# Patient Record
Sex: Female | Born: 1950 | Race: Black or African American | Hispanic: No | Smoking: Never smoker
Health system: Southern US, Community
[De-identification: ages and names within clinical notes are randomized; demographics above are authoritative.]

## PROBLEM LIST (undated history)

## (undated) DIAGNOSIS — M8580 Other specified disorders of bone density and structure, unspecified site: Secondary | ICD-10-CM

## (undated) DIAGNOSIS — Z86718 Personal history of other venous thrombosis and embolism: Secondary | ICD-10-CM

## (undated) DIAGNOSIS — E785 Hyperlipidemia, unspecified: Secondary | ICD-10-CM

## (undated) DIAGNOSIS — M25559 Pain in unspecified hip: Secondary | ICD-10-CM

## (undated) DIAGNOSIS — I2510 Atherosclerotic heart disease of native coronary artery without angina pectoris: Secondary | ICD-10-CM

## (undated) DIAGNOSIS — I10 Essential (primary) hypertension: Secondary | ICD-10-CM

## (undated) DIAGNOSIS — R112 Nausea with vomiting, unspecified: Secondary | ICD-10-CM

## (undated) DIAGNOSIS — R109 Unspecified abdominal pain: Secondary | ICD-10-CM

## (undated) DIAGNOSIS — Z9889 Other specified postprocedural states: Secondary | ICD-10-CM

## (undated) DIAGNOSIS — R51 Headache: Secondary | ICD-10-CM

## (undated) HISTORY — DX: Pain in unspecified hip: M25.559

## (undated) HISTORY — DX: Personal history of other venous thrombosis and embolism: Z86.718

## (undated) HISTORY — DX: Essential (primary) hypertension: I10

## (undated) HISTORY — PX: CARDIAC CATHETERIZATION: SHX172

## (undated) HISTORY — DX: Unspecified abdominal pain: R10.9

## (undated) HISTORY — PX: TUBAL LIGATION: SHX77

## (undated) HISTORY — PX: BREAST SURGERY: SHX581

## (undated) HISTORY — DX: Hyperlipidemia, unspecified: E78.5

## (undated) HISTORY — DX: Atherosclerotic heart disease of native coronary artery without angina pectoris: I25.10

## (undated) HISTORY — PX: ABDOMINAL HYSTERECTOMY: SHX81

## (undated) HISTORY — DX: Other specified disorders of bone density and structure, unspecified site: M85.80

## (undated) HISTORY — PX: BREAST BIOPSY: SHX20

---

## 2005-05-31 ENCOUNTER — Emergency Department: Payer: Self-pay | Admitting: Emergency Medicine

## 2006-01-10 ENCOUNTER — Inpatient Hospital Stay: Payer: Self-pay | Admitting: Cardiovascular Disease

## 2006-01-10 ENCOUNTER — Other Ambulatory Visit: Payer: Self-pay

## 2008-05-06 ENCOUNTER — Ambulatory Visit: Payer: Self-pay | Admitting: Gastroenterology

## 2009-12-24 ENCOUNTER — Ambulatory Visit: Payer: Self-pay

## 2010-02-11 ENCOUNTER — Ambulatory Visit: Payer: Self-pay

## 2012-02-09 ENCOUNTER — Ambulatory Visit: Payer: Self-pay

## 2012-03-08 ENCOUNTER — Ambulatory Visit: Payer: Self-pay | Admitting: Unknown Physician Specialty

## 2012-03-22 ENCOUNTER — Encounter (HOSPITAL_COMMUNITY): Payer: Self-pay | Admitting: Pharmacy Technician

## 2012-03-22 ENCOUNTER — Other Ambulatory Visit: Payer: Self-pay | Admitting: Neurosurgery

## 2012-03-23 ENCOUNTER — Encounter (HOSPITAL_COMMUNITY)
Admission: RE | Admit: 2012-03-23 | Discharge: 2012-03-23 | Disposition: A | Discharge status: Home / Self Care | Payer: BC Managed Care – PPO | Source: Ambulatory Visit | Attending: Neurosurgery | Admitting: Neurosurgery

## 2012-03-23 ENCOUNTER — Encounter (HOSPITAL_COMMUNITY): Payer: Self-pay | Admitting: *Deleted

## 2012-03-23 LAB — BASIC METABOLIC PANEL
BUN: 19 mg/dL (ref 6–23)
CO2: 28 mEq/L (ref 19–32)
Calcium: 9.8 mg/dL (ref 8.4–10.5)
Chloride: 103 mEq/L (ref 96–112)
Creatinine, Ser: 0.71 mg/dL (ref 0.50–1.10)
GFR calc Af Amer: >90 mL/min (ref >90)
GFR calc non Af Amer: >90 mL/min (ref >90)
Glucose, Bld: 113 mg/dL — ABNORMAL HIGH (ref 70–99)
Potassium: 3.9 mEq/L (ref 3.5–5.1)
Sodium: 141 mEq/L (ref 135–145)

## 2012-03-23 LAB — SURGICAL PCR SCREEN
MRSA, PCR: NEGATIVE
Staphylococcus aureus: NEGATIVE

## 2012-03-23 LAB — CBC
HCT: 38.8 % (ref 36.0–46.0)
Hemoglobin: 12.8 g/dL (ref 12.0–15.0)
MCH: 27.2 pg (ref 26.0–34.0)
MCHC: 33 g/dL (ref 30.0–36.0)
MCV: 82.6 fL (ref 78.0–100.0)
Platelets: 348 10*3/uL (ref 150–400)
RBC: 4.7 MIL/uL (ref 3.87–5.11)
RDW: 13.6 % (ref 11.5–15.5)
WBC: 7.1 10*3/uL (ref 4.0–10.5)

## 2012-03-23 MED ORDER — DEXAMETHASONE SODIUM PHOSPHATE 10 MG/ML IJ SOLN
10.0000 mg | INTRAMUSCULAR | Status: AC
  Administered 2012-03-24: 10 mg via INTRAVENOUS
  Filled 2012-03-23: qty 1

## 2012-03-23 MED ORDER — CEFAZOLIN SODIUM-DEXTROSE 2-3 GM-% IV SOLR
2.0000 g | INTRAVENOUS | Status: AC
  Administered 2012-03-24: 2 g via INTRAVENOUS
  Filled 2012-03-23: qty 50

## 2012-03-23 NOTE — Progress Notes (Signed)
Pt said she had a  Cardiac cath 3 or so years ago at [facility], I faxed a request for this information a nd any Stress test , EKG, labs.  Pt was not sure of cardiologist name that did procedure.  I faxed request to Dr Valla Leaver office in [place] for any information, EKG and office notes..  Pt has a paper from Dr Trudee Grip office stating that OR is at 1230, or schedule says 1342, I called and spoke with Erie Noe at Dr Trudee Grip office , she said the plan is for OR at 12:30, pt to be here at 10:30.  Pt was so instructed.

## 2012-03-23 NOTE — Pre-Procedure Instructions (Signed)
20 Mary Maynard  03/23/2012   Your procedure is scheduled on: Friday, July 19th.  Report to [facility] at 11:40 AM.  Call this number if you have problems the morning of surgery: [phone]   Remember:   Do not eat food:After Midnight.     Take these medicines the morning of surgery with A SIP OF WATER: Amlodipine (Norvasc).   Do not wear jewelry, make-up or nail polish.  Do not wear lotions, powders, or perfumes. You may wear deodorant.  Do not shave 48 hours prior to surgery. Men may shave face and neck.  Do not bring valuables to the hospital.  Contacts, dentures or bridgework may not be worn into surgery.  Leave suitcase in the car. After surgery it may be brought to your room.  For patients admitted to the hospital, checkout time is 11:00 AM the day of discharge.   Patients discharged the day of surgery will not be allowed to drive home.  Name and phone number of your driver: NA  Special Instructions: CHG Shower Use Special Wash: 1/2 bottle night before surgery and 1/2 bottle morning of surgery.   Please read over the following fact sheets that you were given: Pain Booklet, Coughing and Deep Breathing and Surgical Site Infection Prevention

## 2012-03-24 ENCOUNTER — Encounter (HOSPITAL_COMMUNITY): Payer: Self-pay | Admitting: Critical Care Medicine

## 2012-03-24 ENCOUNTER — Observation Stay (HOSPITAL_COMMUNITY)
Admission: RE | Admit: 2012-03-24 | Discharge: 2012-03-25 | DRG: 865 | Disposition: A | Discharge status: Home / Self Care | Payer: BC Managed Care – PPO | Source: Ambulatory Visit | Attending: Neurosurgery | Admitting: Neurosurgery

## 2012-03-24 ENCOUNTER — Encounter (HOSPITAL_COMMUNITY)
Admission: RE | Disposition: A | Discharge status: Home / Self Care | Payer: Self-pay | Source: Ambulatory Visit | Attending: Neurosurgery

## 2012-03-24 ENCOUNTER — Ambulatory Visit (HOSPITAL_COMMUNITY): Payer: BC Managed Care – PPO

## 2012-03-24 ENCOUNTER — Encounter (HOSPITAL_COMMUNITY): Payer: Self-pay | Admitting: *Deleted

## 2012-03-24 ENCOUNTER — Ambulatory Visit (HOSPITAL_COMMUNITY): Payer: BC Managed Care – PPO | Admitting: Critical Care Medicine

## 2012-03-24 DIAGNOSIS — M5020 Other cervical disc displacement, unspecified cervical region: Principal | ICD-10-CM | POA: Insufficient documentation

## 2012-03-24 DIAGNOSIS — Z01812 Encounter for preprocedural laboratory examination: Secondary | ICD-10-CM | POA: Insufficient documentation

## 2012-03-24 DIAGNOSIS — Z79899 Other long term (current) drug therapy: Secondary | ICD-10-CM | POA: Insufficient documentation

## 2012-03-24 DIAGNOSIS — E119 Type 2 diabetes mellitus without complications: Secondary | ICD-10-CM | POA: Insufficient documentation

## 2012-03-24 DIAGNOSIS — I10 Essential (primary) hypertension: Secondary | ICD-10-CM | POA: Insufficient documentation

## 2012-03-24 DIAGNOSIS — Z01818 Encounter for other preprocedural examination: Secondary | ICD-10-CM | POA: Insufficient documentation

## 2012-03-24 DIAGNOSIS — Z01810 Encounter for preprocedural cardiovascular examination: Secondary | ICD-10-CM | POA: Insufficient documentation

## 2012-03-24 HISTORY — PX: ANTERIOR CERVICAL DECOMP/DISCECTOMY FUSION: SHX1161

## 2012-03-24 HISTORY — DX: Headache: R51

## 2012-03-24 HISTORY — DX: Nausea with vomiting, unspecified: R11.2

## 2012-03-24 HISTORY — DX: Other specified postprocedural states: Z98.890

## 2012-03-24 LAB — GLUCOSE, CAPILLARY
Glucose-Capillary: 159 mg/dL — ABNORMAL HIGH (ref 70–99)
Glucose-Capillary: 252 mg/dL — ABNORMAL HIGH (ref 70–99)
Glucose-Capillary: 266 mg/dL — ABNORMAL HIGH (ref 70–99)

## 2012-03-24 SURGERY — ANTERIOR CERVICAL DECOMPRESSION/DISCECTOMY FUSION 1 LEVEL
Anesthesia: General | Site: Neck | Laterality: Bilateral | Wound class: Clean

## 2012-03-24 MED ORDER — INSULIN ASPART 100 UNIT/ML ~~LOC~~ SOLN
0.0000 [IU] | Freq: Three times a day (TID) | SUBCUTANEOUS | Status: DC
  Administered 2012-03-25: 5 [IU] via SUBCUTANEOUS

## 2012-03-24 MED ORDER — ONDANSETRON HCL 4 MG/2ML IJ SOLN
INTRAMUSCULAR | Status: DC | PRN
  Administered 2012-03-24: 4 mg via INTRAVENOUS

## 2012-03-24 MED ORDER — 0.9 % SODIUM CHLORIDE (POUR BTL) OPTIME
TOPICAL | Status: DC | PRN
  Administered 2012-03-24: 1000 mL

## 2012-03-24 MED ORDER — ONDANSETRON HCL 4 MG/2ML IJ SOLN
4.0000 mg | INTRAMUSCULAR | Status: DC | PRN
  Administered 2012-03-24: 4 mg via INTRAVENOUS
  Filled 2012-03-24: qty 2

## 2012-03-24 MED ORDER — INSULIN ASPART 100 UNIT/ML ~~LOC~~ SOLN
0.0000 [IU] | Freq: Every day | SUBCUTANEOUS | Status: DC
  Administered 2012-03-24: 3 [IU] via SUBCUTANEOUS

## 2012-03-24 MED ORDER — HYDROMORPHONE HCL PF 1 MG/ML IJ SOLN
INTRAMUSCULAR | Status: AC
  Filled 2012-03-24: qty 1

## 2012-03-24 MED ORDER — METFORMIN HCL 500 MG PO TABS
1000.0000 mg | ORAL_TABLET | Freq: Two times a day (BID) | ORAL | Status: DC
  Administered 2012-03-25: 1000 mg via ORAL
  Filled 2012-03-24 (×4): qty 2

## 2012-03-24 MED ORDER — ACETAMINOPHEN 650 MG RE SUPP: 650.0000 mg | RECTAL | Status: DC | PRN

## 2012-03-24 MED ORDER — HYDROCODONE-ACETAMINOPHEN 5-325 MG PO TABS
1.0000 | ORAL_TABLET | ORAL | Status: DC | PRN
  Administered 2012-03-24 – 2012-03-25 (×2): 1 via ORAL
  Filled 2012-03-24: qty 1

## 2012-03-24 MED ORDER — HYDROCHLOROTHIAZIDE 12.5 MG PO CAPS
12.5000 mg | ORAL_CAPSULE | Freq: Every day | ORAL | Status: DC
  Administered 2012-03-25: 12.5 mg via ORAL
  Filled 2012-03-24 (×2): qty 1

## 2012-03-24 MED ORDER — METHOCARBAMOL 100 MG/ML IJ SOLN
500.0000 mg | Freq: Four times a day (QID) | INTRAVENOUS | Status: DC | PRN
  Filled 2012-03-24: qty 5

## 2012-03-24 MED ORDER — EPHEDRINE SULFATE 50 MG/ML IJ SOLN
INTRAMUSCULAR | Status: DC | PRN
  Administered 2012-03-24: 10 mg via INTRAVENOUS
  Administered 2012-03-24 (×2): 5 mg via INTRAVENOUS

## 2012-03-24 MED ORDER — ACETAMINOPHEN 325 MG PO TABS: 650.0000 mg | ORAL_TABLET | ORAL | Status: DC | PRN

## 2012-03-24 MED ORDER — MENTHOL 3 MG MT LOZG
1.0000 | LOZENGE | OROMUCOSAL | Status: DC | PRN
  Administered 2012-03-24: 3 mg via ORAL
  Filled 2012-03-24: qty 9

## 2012-03-24 MED ORDER — METHOCARBAMOL 500 MG PO TABS: 500.0000 mg | ORAL_TABLET | Freq: Four times a day (QID) | ORAL | Status: DC | PRN

## 2012-03-24 MED ORDER — PHENOL 1.4 % MT LIQD
1.0000 | OROMUCOSAL | Status: DC | PRN
  Administered 2012-03-24: 1 via OROMUCOSAL
  Filled 2012-03-24: qty 177

## 2012-03-24 MED ORDER — HYDROCODONE-ACETAMINOPHEN 5-325 MG PO TABS
ORAL_TABLET | ORAL | Status: AC
  Filled 2012-03-24: qty 1

## 2012-03-24 MED ORDER — FENTANYL CITRATE 0.05 MG/ML IJ SOLN
INTRAMUSCULAR | Status: DC | PRN
  Administered 2012-03-24: 50 ug via INTRAVENOUS
  Administered 2012-03-24: 150 ug via INTRAVENOUS
  Administered 2012-03-24 (×2): 50 ug via INTRAVENOUS

## 2012-03-24 MED ORDER — NEOSTIGMINE METHYLSULFATE 1 MG/ML IJ SOLN
INTRAMUSCULAR | Status: DC | PRN
  Administered 2012-03-24: 4 mg via INTRAVENOUS

## 2012-03-24 MED ORDER — HYDROMORPHONE HCL PF 1 MG/ML IJ SOLN
0.2500 mg | INTRAMUSCULAR | Status: DC | PRN
  Administered 2012-03-24 (×2): 0.5 mg via INTRAVENOUS

## 2012-03-24 MED ORDER — HYDROMORPHONE HCL PF 1 MG/ML IJ SOLN
1.0000 mg | INTRAMUSCULAR | Status: DC | PRN
  Administered 2012-03-24 – 2012-03-25 (×2): 1 mg via INTRAMUSCULAR
  Filled 2012-03-24 (×2): qty 1

## 2012-03-24 MED ORDER — INSULIN ASPART 100 UNIT/ML ~~LOC~~ SOLN
4.0000 [IU] | Freq: Three times a day (TID) | SUBCUTANEOUS | Status: DC
  Administered 2012-03-25: 4 [IU] via SUBCUTANEOUS

## 2012-03-24 MED ORDER — SODIUM CHLORIDE 0.9 % IJ SOLN: 3.0000 mL | Freq: Two times a day (BID) | INTRAMUSCULAR | Status: DC

## 2012-03-24 MED ORDER — AMLODIPINE BESYLATE 5 MG PO TABS
5.0000 mg | ORAL_TABLET | Freq: Every day | ORAL | Status: DC
  Administered 2012-03-25: 5 mg via ORAL
  Filled 2012-03-24: qty 1

## 2012-03-24 MED ORDER — LACTATED RINGERS IV SOLN
INTRAVENOUS | Status: DC | PRN
  Administered 2012-03-24 (×2): via INTRAVENOUS

## 2012-03-24 MED ORDER — ARTIFICIAL TEARS OP OINT
TOPICAL_OINTMENT | OPHTHALMIC | Status: DC | PRN
  Administered 2012-03-24: 1 via OPHTHALMIC

## 2012-03-24 MED ORDER — BACITRACIN 50000 UNITS IM SOLR
INTRAMUSCULAR | Status: AC
  Filled 2012-03-24: qty 1

## 2012-03-24 MED ORDER — SODIUM CHLORIDE 0.9 % IR SOLN
Status: DC | PRN
  Administered 2012-03-24: 14:00:00

## 2012-03-24 MED ORDER — KCL IN DEXTROSE-NACL 20-5-0.45 MEQ/L-%-% IV SOLN
80.0000 mL/h | INTRAVENOUS | Status: DC
  Administered 2012-03-24: 80 mL/h via INTRAVENOUS
  Filled 2012-03-24 (×3): qty 1000

## 2012-03-24 MED ORDER — HEMOSTATIC AGENTS (NO CHARGE) OPTIME
TOPICAL | Status: DC | PRN
  Administered 2012-03-24: 1 via TOPICAL

## 2012-03-24 MED ORDER — METHOCARBAMOL 100 MG/ML IJ SOLN
500.0000 mg | INTRAVENOUS | Status: DC
  Administered 2012-03-24: 500 mg via INTRAVENOUS
  Filled 2012-03-24: qty 5

## 2012-03-24 MED ORDER — SODIUM CHLORIDE 0.9 % IJ SOLN: 3.0000 mL | INTRAMUSCULAR | Status: DC | PRN

## 2012-03-24 MED ORDER — GLYCOPYRROLATE 0.2 MG/ML IJ SOLN
INTRAMUSCULAR | Status: DC | PRN
  Administered 2012-03-24: 0.6 mg via INTRAVENOUS

## 2012-03-24 MED ORDER — LIDOCAINE HCL (CARDIAC) 20 MG/ML IV SOLN
INTRAVENOUS | Status: DC | PRN
  Administered 2012-03-24: 100 mg via INTRAVENOUS

## 2012-03-24 MED ORDER — MIDAZOLAM HCL 5 MG/5ML IJ SOLN
INTRAMUSCULAR | Status: DC | PRN
  Administered 2012-03-24: 2 mg via INTRAVENOUS

## 2012-03-24 MED ORDER — THROMBIN 5000 UNITS EX KIT
PACK | CUTANEOUS | Status: DC | PRN
  Administered 2012-03-24 (×2): 5000 [IU] via TOPICAL

## 2012-03-24 MED ORDER — ONDANSETRON HCL 4 MG/2ML IJ SOLN: 4.0000 mg | Freq: Once | INTRAMUSCULAR | Status: DC | PRN

## 2012-03-24 MED ORDER — LOSARTAN POTASSIUM-HCTZ 100-12.5 MG PO TABS: 1.0000 | ORAL_TABLET | Freq: Every day | ORAL | Status: DC

## 2012-03-24 MED ORDER — PROPOFOL 10 MG/ML IV EMUL
INTRAVENOUS | Status: DC | PRN
  Administered 2012-03-24: 120 mg via INTRAVENOUS

## 2012-03-24 MED ORDER — VECURONIUM BROMIDE 10 MG IV SOLR
INTRAVENOUS | Status: DC | PRN
  Administered 2012-03-24: 1 mg via INTRAVENOUS

## 2012-03-24 MED ORDER — ROCURONIUM BROMIDE 100 MG/10ML IV SOLN
INTRAVENOUS | Status: DC | PRN
  Administered 2012-03-24: 50 mg via INTRAVENOUS

## 2012-03-24 MED ORDER — SIMVASTATIN 5 MG PO TABS
5.0000 mg | ORAL_TABLET | Freq: Every day | ORAL | Status: DC
  Filled 2012-03-24 (×2): qty 1

## 2012-03-24 MED ORDER — CEFAZOLIN SODIUM 1-5 GM-% IV SOLN
1.0000 g | Freq: Three times a day (TID) | INTRAVENOUS | Status: AC
  Administered 2012-03-24 – 2012-03-25 (×2): 1 g via INTRAVENOUS
  Filled 2012-03-24 (×2): qty 50

## 2012-03-24 MED ORDER — SODIUM CHLORIDE 0.9 % IV SOLN
INTRAVENOUS | Status: AC
  Filled 2012-03-24: qty 500

## 2012-03-24 MED ORDER — LOSARTAN POTASSIUM 50 MG PO TABS
100.0000 mg | ORAL_TABLET | Freq: Every day | ORAL | Status: DC
  Administered 2012-03-25: 100 mg via ORAL
  Filled 2012-03-24 (×2): qty 2

## 2012-03-24 SURGICAL SUPPLY — 54 items
BAG DECANTER FOR FLEXI CONT (MISCELLANEOUS) ×2 IMPLANT
BENZOIN TINCTURE PRP APPL 2/3 (GAUZE/BANDAGES/DRESSINGS) ×2 IMPLANT
BRUSH SCRUB EZ PLAIN DRY (MISCELLANEOUS) ×2 IMPLANT
CANISTER SUCTION 2500CC (MISCELLANEOUS) ×2 IMPLANT
CLOTH BEACON ORANGE TIMEOUT ST (SAFETY) ×2 IMPLANT
CONT SPEC 4OZ CLIKSEAL STRL BL (MISCELLANEOUS) ×2 IMPLANT
DRAPE C-ARM 42X72 X-RAY (DRAPES) ×4 IMPLANT
DRAPE LAPAROTOMY 100X72 PEDS (DRAPES) ×2 IMPLANT
DRAPE MICROSCOPE ZEISS OPMI (DRAPES) ×2 IMPLANT
DRAPE POUCH INSTRU U-SHP 10X18 (DRAPES) ×2 IMPLANT
DRAPE SURG 17X23 STRL (DRAPES) ×4 IMPLANT
DRESSING TELFA 8X3 (GAUZE/BANDAGES/DRESSINGS) ×2 IMPLANT
ELECT COATED BLADE 2.86 ST (ELECTRODE) ×2 IMPLANT
ELECT REM PT RETURN 9FT ADLT (ELECTROSURGICAL) ×2
ELECTRODE REM PT RTRN 9FT ADLT (ELECTROSURGICAL) ×1 IMPLANT
GAUZE SPONGE 4X4 16PLY XRAY LF (GAUZE/BANDAGES/DRESSINGS) IMPLANT
GLOVE BIO SURGEON STRL SZ8.5 (GLOVE) ×2 IMPLANT
GLOVE BIOGEL PI IND STRL 7.0 (GLOVE) ×1 IMPLANT
GLOVE BIOGEL PI INDICATOR 7.0 (GLOVE) ×1
GLOVE ECLIPSE 7.5 STRL STRAW (GLOVE) ×2 IMPLANT
GLOVE EXAM NITRILE LRG STRL (GLOVE) ×2 IMPLANT
GLOVE EXAM NITRILE XL STR (GLOVE) IMPLANT
GLOVE EXAM NITRILE XS STR PU (GLOVE) IMPLANT
GLOVE SS BIOGEL STRL SZ 8 (GLOVE) ×1 IMPLANT
GLOVE SUPERSENSE BIOGEL SZ 8 (GLOVE) ×1
GLOVE SURG SS PI 6.5 STRL IVOR (GLOVE) ×4 IMPLANT
GOWN BRE IMP SLV AUR LG STRL (GOWN DISPOSABLE) ×2 IMPLANT
GOWN BRE IMP SLV AUR XL STRL (GOWN DISPOSABLE) ×4 IMPLANT
GOWN STRL REIN 2XL LVL4 (GOWN DISPOSABLE) IMPLANT
HEAD HALTER (SOFTGOODS) ×2 IMPLANT
INVIZIA DRILL BIT IMPLANT
KIT BASIN OR (CUSTOM PROCEDURE TRAY) ×2 IMPLANT
KIT ROOM TURNOVER OR (KITS) ×2 IMPLANT
NEEDLE SPNL 20GX3.5 QUINCKE YW (NEEDLE) ×2 IMPLANT
NS IRRIG 1000ML POUR BTL (IV SOLUTION) ×2 IMPLANT
PACK LAMINECTOMY NEURO (CUSTOM PROCEDURE TRAY) ×2 IMPLANT
PAD ARMBOARD 7.5X6 YLW CONV (MISCELLANEOUS) ×6 IMPLANT
PATTIES SURGICAL .25X.25 (GAUZE/BANDAGES/DRESSINGS) IMPLANT
PATTIES SURGICAL .75X.75 (GAUZE/BANDAGES/DRESSINGS) ×2 IMPLANT
PUTTY BONE GRAFT KIT 2.5ML (Bone Implant) ×2 IMPLANT
RUBBERBAND STERILE (MISCELLANEOUS) ×4 IMPLANT
SPACER TMS 11X14X6MM (Spacer) ×2 IMPLANT
SPONGE GAUZE 4X4 12PLY (GAUZE/BANDAGES/DRESSINGS) ×2 IMPLANT
SPONGE INTESTINAL PEANUT (DISPOSABLE) ×2 IMPLANT
SPONGE SURGIFOAM ABS GEL SZ50 (HEMOSTASIS) ×2 IMPLANT
STRIP CLOSURE SKIN 1/2X4 (GAUZE/BANDAGES/DRESSINGS) ×2 IMPLANT
SUT PDS AB 5-0 P3 18 (SUTURE) ×2 IMPLANT
SUT VIC AB 3-0 CP2 18 (SUTURE) ×2 IMPLANT
SYR 20ML ECCENTRIC (SYRINGE) ×2 IMPLANT
TOOL MATCHSTK 3MM (MISCELLANEOUS) ×2 IMPLANT
TOWEL OR 17X24 6PK STRL BLUE (TOWEL DISPOSABLE) ×2 IMPLANT
TOWEL OR 17X26 10 PK STRL BLUE (TOWEL DISPOSABLE) ×2 IMPLANT
TRAP SPECIMEN MUCOUS 40CC (MISCELLANEOUS) ×2 IMPLANT
WATER STERILE IRR 1000ML POUR (IV SOLUTION) ×2 IMPLANT

## 2012-03-24 NOTE — H&P (Signed)
Mary Maynard is an 61 y.o. female.   Chief Complaint: Neck and right arm pain HPI: The patient is a 61 year old female who presented with neck and right arm pain. She had an MRI scan which showed a disc abnormality at C5-6 and she was tried on aggressive conservative therapy without improvement. An evaluation the options and discussing the options the patient requested surgery and now comes for a C5-6 anterior cervical discectomy with fusion and plating. I had a long discussion with her regarding the risks and benefits of surgical intervention. The risks discussed include but are not limited to bleeding infection weakness numbness paralysis spinal fluid leakage coma nonunion hoarseness and death. We have discussed alternative methods of therapy offered risks and benefits of nonintervention. Ms. Mary Maynard is had the opportunity numerous questions and appears to understand. With this information in hand she has requested we proceed with surgery.  Past Medical History  Diagnosis Date  . PONV (postoperative nausea and vomiting)     a little  . Diabetes mellitus   . Headache     since accident    Past Surgical History  Procedure Date  . Cardiac catheterization   . Breast surgery     Lumpectomy-  . Abdominal hysterectomy     partial  . Tubal ligation     History reviewed. No pertinent family history. Social History:  reports that she has never smoked. She does not have any smokeless tobacco history on file. She reports that she drinks alcohol. She reports that she does not use illicit drugs.  Allergies:  Allergies  Allergen Reactions  . Percocet (Oxycodone-Acetaminophen) Nausea And Vomiting  . Codeine Nausea And Vomiting  . Lyrica (Pregabalin) Nausea And Vomiting  . Prednisone Hives  . Tramadol Nausea And Vomiting    Medications Prior to Admission  Medication Sig Dispense Refill  . amLODipine (NORVASC) 5 MG tablet Take 5 mg by mouth daily.      Marland Kitchen aspirin EC 81 MG tablet Take 81 mg by  mouth daily.      Marland Kitchen losartan-hydrochlorothiazide (HYZAAR) 100-12.5 MG per tablet Take 1 tablet by mouth daily.      . metFORMIN (GLUCOPHAGE) 500 MG tablet Take 1,000 mg by mouth 2 (two) times daily with a meal.      . pravastatin (PRAVACHOL) 40 MG tablet Take 40 mg by mouth daily.        Results for orders placed during the hospital encounter of 03/24/12 (from the past 48 hour(s))  GLUCOSE, CAPILLARY     Status: Abnormal   Collection Time   03/24/12 11:21 AM      Component Value Range Comment   Glucose-Capillary 146 (*) 70 - 99 mg/dL    Dg Chest 2 View  11/11/6576  *RADIOLOGY REPORT*  Clinical Data: Preop for a ACDF.  Diabetes.  CHEST - 2 VIEW  Comparison: None.  Findings: The heart size is normal.  The lungs are clear.  The visualized soft tissues and bony thorax are unremarkable.  IMPRESSION: Negative chest.  Original Report Authenticated By: Jamesetta Orleans. MATTERN, M.D.    A comprehensive review of systems was negative.  Blood pressure 123/79, pulse 61, temperature 97.8 F (36.6 C), temperature source Oral, resp. rate 20, height 5\' 2"  (1.575 m), weight 75.3 kg (166 lb 0.1 oz), SpO2 99.00%.  The patient is awake alert and oriented. She is no facial asymmetry. Her gait is nonantalgic. His sensation are intact. 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  Reinaldo Meeker, MD 03/24/2012, 12:33 PM

## 2012-03-24 NOTE — Progress Notes (Signed)
Orthopedic Tech Progress Note Patient Details:  Mary Maynard 05/26/1951 [id number]  Ortho Devices Type of Ortho Device: Soft collar Ortho Device/Splint Interventions: Application   Jennye Moccasin 03/24/2012, 3:53 PM

## 2012-03-24 NOTE — Plan of Care (Signed)
Problem: Consults Goal: Diagnosis - Spinal Surgery Outcome: Completed/Met Date Met:  03/24/12 Cervical Spine Fusion     

## 2012-03-24 NOTE — Transfer of Care (Signed)
Immediate Anesthesia Transfer of Care Note  Patient: Mary Maynard  Procedure(s) Performed: Procedure(s) (LRB): ANTERIOR CERVICAL DECOMPRESSION/DISCECTOMY FUSION 1 LEVEL (Bilateral)  Patient Location: PACU  Anesthesia Type: General  Level of Consciousness: awake, alert  and oriented  Airway & Oxygen Therapy: Patient Spontanous Breathing and Patient connected to nasal cannula oxygen  Post-op Assessment: Report given to PACU RN, Post -op Vital signs reviewed and stable and Patient moving all extremities X 4  Post vital signs: Reviewed and stable  Complications: No apparent anesthesia complications

## 2012-03-24 NOTE — Anesthesia Preprocedure Evaluation (Addendum)
Anesthesia Evaluation  Patient identified by MRN, date of birth, ID band Patient awake    Reviewed: Allergy & Precautions, H&P , NPO status , Patient's Chart, lab work & pertinent test results  History of Anesthesia Complications (+) PONV  Airway Mallampati: I TM Distance: >3 FB Neck ROM: Full    Dental  (+) Dental Advisory Given   Pulmonary          Cardiovascular hypertension, Pt. on medications     Neuro/Psych  Headaches,    GI/Hepatic   Endo/Other  Well Controlled, Type 2, Oral Hypoglycemic Agents  Renal/GU      Musculoskeletal   Abdominal   Peds  Hematology   Anesthesia Other Findings   Reproductive/Obstetrics                          Anesthesia Physical Anesthesia Plan  ASA: II  Anesthesia Plan: General   Post-op Pain Management:    Induction: Intravenous  Airway Management Planned: Oral ETT  Additional Equipment:   Intra-op Plan:   Post-operative Plan: Extubation in OR  Informed Consent: I have reviewed the patients History and Physical, chart, labs and discussed the procedure including the risks, benefits and alternatives for the proposed anesthesia with the patient or authorized representative who has indicated his/her understanding and acceptance.   Dental advisory given  Plan Discussed with: Surgeon and CRNA  Anesthesia Plan Comments:        Anesthesia Quick Evaluation

## 2012-03-24 NOTE — Preoperative (Signed)
Beta Blockers   Reason not to administer Beta Blockers:Not Applicable 

## 2012-03-24 NOTE — Anesthesia Postprocedure Evaluation (Signed)
Anesthesia Post Note  Patient: Mary Maynard  Procedure(s) Performed: Procedure(s) (LRB): ANTERIOR CERVICAL DECOMPRESSION/DISCECTOMY FUSION 1 LEVEL (Bilateral)  Anesthesia type: general  Patient location: PACU  Post pain: Pain level controlled  Post assessment: Patient's Cardiovascular Status Stable  Last Vitals:  Filed Vitals:   03/24/12 1508  BP:   Pulse:   Temp: 36 C  Resp:     Post vital signs: Reviewed and stable  Level of consciousness: sedated  Complications: No apparent anesthesia complications

## 2012-03-24 NOTE — Progress Notes (Signed)
[facility] said that they do not have a patient with this name or birthdate.

## 2012-03-24 NOTE — Progress Notes (Signed)
No information received from Dr. Janean Sark office.  Cardiac Cath from 2007 received from [facility]

## 2012-03-24 NOTE — Anesthesia Procedure Notes (Signed)
Procedure Name: Intubation Date/Time: 03/24/2012 1:21 PM Performed by: Elon Alas Pre-anesthesia Checklist: Patient identified, Timeout performed, Emergency Drugs available, Suction available and Patient being monitored Patient Re-evaluated:Patient Re-evaluated prior to inductionOxygen Delivery Method: Circle system utilized Preoxygenation: Pre-oxygenation with 100% oxygen Intubation Type: IV induction Ventilation: Mask ventilation without difficulty Laryngoscope Size: Mac and 3 Grade View: Grade II Tube type: Oral Tube size: 7.0 mm Number of attempts: 1 Airway Equipment and Method: Stylet Placement Confirmation: ETT inserted through vocal cords under direct vision,  positive ETCO2 and breath sounds checked- equal and bilateral Secured at: 21 cm Tube secured with: Tape Dental Injury: Teeth and Oropharynx as per pre-operative assessment

## 2012-03-24 NOTE — Op Note (Signed)
Preop diagnosis: Herniated disc C5-6 .Diagnosis: Same Procedure: C5-6 decompressive anterior cervical discectomy with trabecular metal fusion and in VISI a anterior cervical plating Surgeon: Naryah Clenney Assistant: Lovell Sheehan  After being placed the supine position and 5 pounds halter traction the patient's neck was prepped and draped in the usual sterile fashion. Localizing fluoroscopy was used prior to incision to identify the appropriate level. Transverse incision was made in the right anterior neck started the midline and headed towards the medial aspect of the sternocleidomastoid muscle. The platysma muscle was then incised transversely. The natural fascial plane between the strap muscles medially and the sternocleidomastoid laterally was identified and followed down to the anterior aspect the cervical spine. Longus coli muscles were identified and split in the midline and stripped away bilaterally with unipolar coagulation and Barista. Self-retaining tract was placed for exposure and x-ray showed approach to the appropriate level. Using a 15 blade the annulus the disc at C5-6 was incised. Using pituitary rongeurs and curettes approximately 90% of the disc material was removed. High-speed drill was used to widen the interspace and bony shavings were saved for use later in the case. At this time the microscope was draped brought into the field and used for the remainder of the case. Using microdissection technique the remainder of the disc material down to the posterior longitudinal ligament was removed. Ligament was then incised transversely and the cut edges removed a Kerrison punch. A large central disc herniation was identified dissected free from the dura and removed in a piecemeal fashion and this gave excellent decompression of the underlying spinal dura. Dissection was carried out laterally until we reached the proximal foramen bilaterally. At this time inspection was carried out in all directions  for any evidence of residual compression and none could be identified. Large amounts of irrigation were carried out and any bleeding controlled with upper coagulation and Gelfoam. Measurements were taken and a 6 mm lordotic graft was chosen. Was filled with a mixture of autologous bone and morselized allograft. Confirming hemostasis once more this was impacted into the disc space without difficulty and fluoroscopy showed to be in good position. A 22 mm in VISI a anterior cervical plate was then chosen and screw holes were placed followed by placing of 12 mm screws x4. Locking mechanism was rotated into the locked position and final fluoroscopy showed good position of the plates screws and plugs. Irrigation was carried out and any bleeding controlled with upper coagulation and Gelfoam. The was then closed with inverted Vicryl on the platysma muscle inverted 5-0 PDS in the subcuticular layer and Steri-Strips on the skin. A sterile dressing was then applied the patient was extubated to discomfort in stable condition.

## 2012-03-25 NOTE — Discharge Summary (Signed)
Physician Discharge Summary  Patient ID: Mary Maynard MRN: [id number] DOB/AGE: 61/08/1951 61 y.o.  Admit date: 03/24/2012 Discharge date: 03/25/2012  Admission Diagnoses:c56 hnp Discharge Diagnoses: same   Discharged Condition: stable, no weakness  Hospital Course: surgery  Consults: none  Significant Diagnostic Studies: mri  Treatments: cervical fusion  Discharge Exam: Blood pressure 134/71, pulse 72, temperature 98.2 F (36.8 C), temperature source Oral, resp. rate 16, height 5\' 2"  (1.575 m), weight 75.3 kg (166 lb 0.1 oz), SpO2 99.00%. Voice nl, no weakness. ambulating  Disposition: home at the care of her son. On percocet and diazepam   Medication List  As of 03/25/2012 11:04 AM   TAKE these medications         amLODipine 5 MG tablet   Commonly known as: NORVASC   Take 5 mg by mouth daily.      aspirin EC 81 MG tablet   Take 81 mg by mouth daily.      losartan-hydrochlorothiazide 100-12.5 MG per tablet   Commonly known as: HYZAAR   Take 1 tablet by mouth daily.      metFORMIN 500 MG tablet   Commonly known as: GLUCOPHAGE   Take 1,000 mg by mouth 2 (two) times daily with a meal.      pravastatin 40 MG tablet   Commonly known as: PRAVACHOL   Take 40 mg by mouth daily.             Signed: Karn Cassis 03/25/2012, 11:04 AM

## 2012-03-25 NOTE — Discharge Summary (Deleted)
Physician Discharge Summary  Patient ID: Mary Maynard MRN: [id number] DOB/AGE: [birth year] 61 y.o.  Admit date: 03/24/2012 Discharge date: 03/25/2012  Admission Diagnoses:l45 hnp  Discharge Diagnoses: same   Discharged Condition: no leg pain  Hospital Course:lumbar discectomy  Consults: none Significant Diagnostic Studies: mri  Treatments: surgery  Discharge Exam: Blood pressure 134/71, pulse 72, temperature 98.2 F (36.8 C), temperature source Oral, resp. rate 16, height 5\' 2"  (1.575 m), weight 75.3 kg (166 lb 0.1 oz), SpO2 99.00%. Incisional pain. No weakness  Disposition: home on percocet and diazepam   Medication List  As of 03/25/2012  8:36 AM   ASK your doctor about these medications         amLODipine 5 MG tablet   Commonly known as: NORVASC   Take 5 mg by mouth daily.      aspirin EC 81 MG tablet   Take 81 mg by mouth daily.      losartan-hydrochlorothiazide 100-12.5 MG per tablet   Commonly known as: HYZAAR   Take 1 tablet by mouth daily.      metFORMIN 500 MG tablet   Commonly known as: GLUCOPHAGE   Take 1,000 mg by mouth 2 (two) times daily with a meal.      pravastatin 40 MG tablet   Commonly known as: PRAVACHOL   Take 40 mg by mouth daily.             Signed: Karn Cassis 03/25/2012, 8:36 AM

## 2012-03-25 NOTE — Progress Notes (Signed)
Patient ID: Mary Maynard, female   DOB: 1951/08/19, 61 y.o.   MRN: [id number] She feels she is to tired to go home. Able to swallow. No weakness. Wants to wait till tomorrow to go home. Lives alone

## 2012-03-25 NOTE — Progress Notes (Signed)
Patient ID: Mary Maynard, female   DOB: [birth year], 61 y.o.   MRN: [id number] Called back by nurses. Ms Selman changed her mind and now wants to go home. Percocet and diazepam given.

## 2012-03-25 NOTE — Progress Notes (Signed)
Patient ID: Mary Maynard, female   DOB: 1951-05-10, 61 y.o.   MRN: [id number] Stable, wound dry, no weakness. Pain to swallow. i will see her again to see if she is ready to go home.

## 2012-03-25 NOTE — Discharge Summary (Signed)
Physician Discharge Summary  Patient ID: Mary Maynard MRN: [id number] DOB/AGE: 1951/06/04 61 y.o.  Admit date: 03/24/2012 Discharge date: 03/25/2012  Admission Diagnoses:c56 hnp  Discharge Diagnoses: same   Discharged Condition: no pain  Hospital Course: c56 fusion  Consults: none Significant Diagnostic Studies: mri  Treatments: surgery  Discharge Exam: Blood pressure 134/71, pulse 72, temperature 98.2 F (36.8 C), temperature source Oral, resp. rate 16, height 5\' 2"  (1.575 m), weight 75.3 kg (166 lb 0.1 oz), SpO2 99.00%. Wound dry, no weakness  Disposition: home   Medication List  As of 03/25/2012  8:53 AM   TAKE these medications         amLODipine 5 MG tablet   Commonly known as: NORVASC   Take 5 mg by mouth daily.      aspirin EC 81 MG tablet   Take 81 mg by mouth daily.      losartan-hydrochlorothiazide 100-12.5 MG per tablet   Commonly known as: HYZAAR   Take 1 tablet by mouth daily.      metFORMIN 500 MG tablet   Commonly known as: GLUCOPHAGE   Take 1,000 mg by mouth 2 (two) times daily with a meal.      pravastatin 40 MG tablet   Commonly known as: PRAVACHOL   Take 40 mg by mouth daily.             Signed: Karn Cassis 03/25/2012, 8:53 AM

## 2012-03-30 ENCOUNTER — Encounter (HOSPITAL_COMMUNITY): Payer: Self-pay | Admitting: Neurosurgery

## 2013-03-07 ENCOUNTER — Emergency Department: Payer: Self-pay | Admitting: Emergency Medicine

## 2013-03-11 ENCOUNTER — Emergency Department: Payer: Self-pay | Admitting: Emergency Medicine

## 2013-03-13 ENCOUNTER — Ambulatory Visit: Payer: Self-pay

## 2013-05-07 DIAGNOSIS — Z86718 Personal history of other venous thrombosis and embolism: Secondary | ICD-10-CM

## 2013-05-07 HISTORY — DX: Personal history of other venous thrombosis and embolism: Z86.718

## 2013-06-20 ENCOUNTER — Emergency Department: Payer: Self-pay | Admitting: Emergency Medicine

## 2014-03-19 ENCOUNTER — Ambulatory Visit: Payer: Self-pay

## 2014-03-27 ENCOUNTER — Ambulatory Visit: Payer: Self-pay | Admitting: Specialist

## 2015-02-10 ENCOUNTER — Telehealth: Payer: Self-pay | Admitting: Unknown Physician Specialty

## 2015-02-10 NOTE — Telephone Encounter (Signed)
Discussed with patient that lingering cough is normal up to 4 weeks following illness.  Pt states cough is better.

## 2015-02-10 NOTE — Telephone Encounter (Signed)
Pt called stated she has a lingering cough from a bacterial infection previously diagnosed by Elnita Maxwellheryl. Pt would like a call back to answer questions about the cough. Please call pt @ [phone]. Thanks.

## 2015-02-13 ENCOUNTER — Telehealth: Payer: Self-pay | Admitting: *Deleted

## 2015-02-13 NOTE — Telephone Encounter (Signed)
Pt came in office and wanted to let Mary Maynard know that it was time for her colonoscopy. Pt has a CPE in July. Pt is requesting a call back to let her know the appt date and time.  Pt number is [phone]

## 2015-02-13 NOTE — Telephone Encounter (Signed)
She is not due for a colonoscopy until 2019.  She is due for a physical though.

## 2015-02-13 NOTE — Telephone Encounter (Signed)
Mary Maynard could you put in the referral for this colonoscopy please

## 2015-02-14 ENCOUNTER — Telehealth: Payer: Self-pay

## 2015-02-14 NOTE — Telephone Encounter (Signed)
Called and left a voice mail telling patient Mary Maynard's response

## 2015-02-14 NOTE — Telephone Encounter (Signed)
Patient called concerning her colonoscopy, she states that the insurance company states that it is time for her to have another colonoscopy. I told patient that this is a recomendation from them, I checked in Garment/textile technologist, she had a normal colonoscopy in 2009 so she is due in 2019.

## 2015-03-05 ENCOUNTER — Other Ambulatory Visit: Payer: Self-pay | Admitting: Unknown Physician Specialty

## 2015-03-14 ENCOUNTER — Telehealth: Payer: Self-pay | Admitting: Unknown Physician Specialty

## 2015-03-14 MED ORDER — PRAVASTATIN SODIUM 40 MG PO TABS: 40.0000 mg | ORAL_TABLET | Freq: Every day | ORAL | Status: DC

## 2015-03-14 NOTE — Telephone Encounter (Signed)
Routing to provider. There was 2 different people with this name and the wrong one was scheduled on accident. So now this patient needs a refill to get to her appointment in august.

## 2015-03-14 NOTE — Telephone Encounter (Signed)
Pt came in for an appt that had been canceled due to an error on our end and she now needs a refill on her pravastatin to last her until august 1. Pt would like it to go to [address].

## 2015-03-16 ENCOUNTER — Emergency Department
Admission: EM | Admit: 2015-03-16 | Discharge: 2015-03-16 | Disposition: A | Discharge status: Home / Self Care | Payer: BLUE CROSS/BLUE SHIELD | Attending: Emergency Medicine | Admitting: Emergency Medicine

## 2015-03-16 ENCOUNTER — Encounter: Payer: Self-pay | Admitting: Emergency Medicine

## 2015-03-16 DIAGNOSIS — Z79899 Other long term (current) drug therapy: Secondary | ICD-10-CM | POA: Diagnosis not present

## 2015-03-16 DIAGNOSIS — Z7982 Long term (current) use of aspirin: Secondary | ICD-10-CM | POA: Diagnosis not present

## 2015-03-16 DIAGNOSIS — J069 Acute upper respiratory infection, unspecified: Secondary | ICD-10-CM | POA: Insufficient documentation

## 2015-03-16 DIAGNOSIS — E119 Type 2 diabetes mellitus without complications: Secondary | ICD-10-CM | POA: Diagnosis not present

## 2015-03-16 DIAGNOSIS — R05 Cough: Secondary | ICD-10-CM | POA: Diagnosis present

## 2015-03-16 MED ORDER — AZITHROMYCIN 250 MG PO TABS: ORAL_TABLET | ORAL | Status: DC

## 2015-03-16 MED ORDER — GUAIFENESIN-CODEINE 100-10 MG/5ML PO SOLN: 10.0000 mL | ORAL | Status: DC | PRN

## 2015-03-16 NOTE — ED Provider Notes (Signed)
[facility] Emergency Department Provider Note  ____________________________________________  Time seen: Approximately 11:02 AM  I have reviewed the triage vital signs and the nursing notes.   HISTORY  Chief Complaint URI   HPI Mary Maynard is a 64 y.o. female who presents to the emergency room for evaluation of an upper respiratory infection. States that she had an appointment last week to see her doctor but her doctor was on vacation. Complains of cough, sore throat, tonsils enlarged, fever and chills. Symptoms started about 4-5 days ago. No relief with any over-the-counter medications.   Past Medical History  Diagnosis Date  . PONV (postoperative nausea and vomiting)     a little  . Diabetes mellitus   . Headache(784.0)     since accident    There are no active problems to display for this patient.   Past Surgical History  Procedure Laterality Date  . Cardiac catheterization    . Breast surgery      Lumpectomy-  . Abdominal hysterectomy      partial  . Tubal ligation    . Anterior cervical decomp/discectomy fusion  03/24/2012    Procedure: ANTERIOR CERVICAL DECOMPRESSION/DISCECTOMY FUSION 1 LEVEL;  Surgeon: Reinaldo Meekerandy O Kritzer, MD;  Location: [facility];  Service: Neurosurgery;  Laterality: Bilateral;  Cervical five-six anterior cervical discectomy with fusion    Current Outpatient Rx  Name  Route  Sig  Dispense  Refill  . amLODipine (NORVASC) 5 MG tablet   Oral   Take 5 mg by mouth daily.         Marland Kitchen. aspirin EC 81 MG tablet   Oral   Take 81 mg by mouth daily.         Marland Kitchen. azithromycin (ZITHROMAX Z-PAK) 250 MG tablet      Take 2 tablets (500 mg) on  Day 1,  followed by 1 tablet (250 mg) once daily on Days 2 through 5.   6 each   0   . guaiFENesin-codeine 100-10 MG/5ML syrup   Oral   Take 10 mLs by mouth every 4 (four) hours as needed for cough.   180 mL   0   . losartan-hydrochlorothiazide (HYZAAR) 50-12.5 MG per tablet      1  Tablet once each day ORAL   90 tablet   0   . metFORMIN (GLUCOPHAGE) 500 MG tablet   Oral   Take 1,000 mg by mouth 2 (two) times daily with a meal.         . pravastatin (PRAVACHOL) 40 MG tablet   Oral   Take 1 tablet (40 mg total) by mouth daily.   30 tablet   0     Allergies Percocet; Codeine; Lyrica; Prednisone; and Tramadol  No family history on file.  Social History History  Substance Use Topics  . Smoking status: Never Smoker   . Smokeless tobacco: Not on file  . Alcohol Use: Yes     Comment: rarely    Review of Systems Constitutional: No fever/chills Eyes: No visual changes. ENT: Positive for sore throat, nasal congestion. Cardiovascular: Denies chest pain. Respiratory: Denies shortness of breath. Positive for cough Gastrointestinal: No abdominal pain.  No nausea, no vomiting.  No diarrhea.  No constipation. Genitourinary: Negative for dysuria. Musculoskeletal: Negative for back pain. Skin: Negative for rash. Neurological: Negative for headaches, focal weakness or numbness.  10-point ROS otherwise negative.  ____________________________________________   PHYSICAL EXAM:  VITAL SIGNS: ED Triage Vitals  Enc Vitals Group  BP 03/16/15 1040 129/72 mmHg     Pulse Rate 03/16/15 1040 55     Resp 03/16/15 1040 20     Temp 03/16/15 1040 98.1 F (36.7 C)     Temp Source 03/16/15 1040 Oral     SpO2 03/16/15 1040 98 %     Weight 03/16/15 1037 150 lb (68.04 kg)     Height 03/16/15 1037  (1.575 m)     Head Cir --      Peak Flow --      Pain Score 03/16/15 1037 5     Pain Loc --      Pain Edu? --      Excl. in GC? --     Constitutional: Alert and oriented. Well appearing and in no acute distress. Eyes: Conjunctivae are normal. PERRL. EOMI. Head: Atraumatic. Nose: Positive congestion and rhinorrhea. Turbinates swollen bilaterally. Mouth/Throat: Mucous membranes are moist.  Oropharynx erythematous with minimal tonsillar enlargement. No  exudate. Neck: No stridor. No Cervical adenopathy.  Cardiovascular: Normal rate, regular rhythm. Grossly normal heart sounds.  Good peripheral circulation. Respiratory: Normal respiratory effort.  No retractions. Lungs CTAB. Musculoskeletal: No lower extremity tenderness nor edema.  No joint effusions. Neurologic:  Normal speech and language. No gross focal neurologic deficits are appreciated. Speech is normal. No gait instability. Skin:  Skin is warm, dry and intact. No rash noted. Psychiatric: Mood and affect are normal. Speech and behavior are normal.  ____________________________________________   LABS (all labs ordered are listed, but only abnormal results are displayed)  Labs Reviewed - No data to display ____________________________________________  EKG  Not applicable ____________________________________________  RADIOLOGY  Deferred ____________________________________________   PROCEDURES  Procedure(s) performed: None  Critical Care performed: No  ____________________________________________   INITIAL IMPRESSION / ASSESSMENT AND PLAN / ED COURSE  Pertinent labs & imaging results that were available during my care of the patient were reviewed by me and considered in my medical decision making (see chart for details).  Acute upper respiratory infection. Rx given for Z-Pak, Robitussin-AC despite codeine allergy patient states that she can take cough syrup as long she takes it with food. She will take Tylenol and/or ibuprofen over-the-counter as needed and follow up with her PCP as directed.  Patient voices no other emergency medical complaints at this time and will return with any worsening symptomology. ____________________________________________   FINAL CLINICAL IMPRESSION(S) / ED DIAGNOSES  Final diagnoses:  URI, acute      Evangeline Dakin, PA-C 03/16/15 1130  Minna Antis, MD 03/16/15 1454

## 2015-03-16 NOTE — ED Notes (Signed)
AAOx3.  Skin warm and dry.  NAD.  No SOB/DOE,  D/C home

## 2015-03-16 NOTE — ED Notes (Signed)
Patient presents to the ED with cough, congestion and sore throat x 3 days.  Patient is in no obvious distress at this time.

## 2015-03-16 NOTE — Discharge Instructions (Signed)
Upper Respiratory Infection, Adult An upper respiratory infection (URI) is also sometimes known as the common cold. The upper respiratory tract includes the nose, sinuses, throat, trachea, and bronchi. Bronchi are the airways leading to the lungs. Most people improve within 1 week, but symptoms can last up to 2 weeks. A residual cough may last even longer.  CAUSES Many different viruses can infect the tissues lining the upper respiratory tract. The tissues become irritated and inflamed and often become very moist. Mucus production is also common. A cold is contagious. You can easily spread the virus to others by oral contact. This includes kissing, sharing a glass, coughing, or sneezing. Touching your mouth or nose and then touching a surface, which is then touched by another person, can also spread the virus. SYMPTOMS  Symptoms typically develop 1 to 3 days after you come in contact with a cold virus. Symptoms vary from person to person. They may include:  Runny nose.  Sneezing.  Nasal congestion.  Sinus irritation.  Sore throat.  Loss of voice (laryngitis).  Cough.  Fatigue.  Muscle aches.  Loss of appetite.  Headache.  Low-grade fever. DIAGNOSIS  You might diagnose your own cold based on familiar symptoms, since most people get a cold 2 to 3 times a year. Your caregiver can confirm this based on your exam. Most importantly, your caregiver can check that your symptoms are not due to another disease such as strep throat, sinusitis, pneumonia, asthma, or epiglottitis. Blood tests, throat tests, and X-rays are not necessary to diagnose a common cold, but they may sometimes be helpful in excluding other more serious diseases. Your caregiver will decide if any further tests are required. RISKS AND COMPLICATIONS  You may be at risk for a more severe case of the common cold if you smoke cigarettes, have chronic heart disease (such as heart failure) or lung disease (such as asthma), or if  you have a weakened immune system. The very young and very old are also at risk for more serious infections. Bacterial sinusitis, middle ear infections, and bacterial pneumonia can complicate the common cold. The common cold can worsen asthma and chronic obstructive pulmonary disease (COPD). Sometimes, these complications can require emergency medical care and may be life-threatening. PREVENTION  The best way to protect against getting a cold is to practice good hygiene. Avoid oral or hand contact with people with cold symptoms. Wash your hands often if contact occurs. There is no clear evidence that vitamin C, vitamin E, echinacea, or exercise reduces the chance of developing a cold. However, it is always recommended to get plenty of rest and practice good nutrition. TREATMENT  Treatment is directed at relieving symptoms. There is no cure. Antibiotics are not effective, because the infection is caused by a virus, not by bacteria. Treatment may include:  Increased fluid intake. Sports drinks offer valuable electrolytes, sugars, and fluids.  Breathing heated mist or steam (vaporizer or shower).  Eating chicken soup or other clear broths, and maintaining good nutrition.  Getting plenty of rest.  Using gargles or lozenges for comfort.  Controlling fevers with ibuprofen or acetaminophen as directed by your caregiver.  Increasing usage of your inhaler if you have asthma. Zinc gel and zinc lozenges, taken in the first 24 hours of the common cold, can shorten the duration and lessen the severity of symptoms. Pain medicines may help with fever, muscle aches, and throat pain. A variety of non-prescription medicines are available to treat congestion and runny nose. Your caregiver   can make recommendations and may suggest nasal or lung inhalers for other symptoms.  HOME CARE INSTRUCTIONS   Only take over-the-counter or prescription medicines for pain, discomfort, or fever as directed by your  caregiver.  Use a warm mist humidifier or inhale steam from a shower to increase air moisture. This may keep secretions moist and make it easier to breathe.  Drink enough water and fluids to keep your urine clear or pale yellow.  Rest as needed.  Return to work when your temperature has returned to normal or as your caregiver advises. You may need to stay home longer to avoid infecting others. You can also use a face mask and careful hand washing to prevent spread of the virus. SEEK MEDICAL CARE IF:   After the first few days, you feel you are getting worse rather than better.  You need your caregiver's advice about medicines to control symptoms.  You develop chills, worsening shortness of breath, or brown or red sputum. These may be signs of pneumonia.  You develop yellow or brown nasal discharge or pain in the face, especially when you bend forward. These may be signs of sinusitis.  You develop a fever, swollen neck glands, pain with swallowing, or white areas in the back of your throat. These may be signs of strep throat. SEEK IMMEDIATE MEDICAL CARE IF:   You have a fever.  You develop severe or persistent headache, ear pain, sinus pain, or chest pain.  You develop wheezing, a prolonged cough, cough up blood, or have a change in your usual mucus (if you have chronic lung disease).  You develop sore muscles or a stiff neck. Document Released: 02/16/2001 Document Revised: 11/15/2011 Document Reviewed: 11/28/2013 ExitCare Patient Information 2015 [employer]. This information is not intended to replace advice given to you by your health care provider. Make sure you discuss any questions you have with your health care provider.  

## 2015-03-17 ENCOUNTER — Other Ambulatory Visit: Payer: Self-pay

## 2015-03-17 ENCOUNTER — Telehealth: Payer: Self-pay | Admitting: Unknown Physician Specialty

## 2015-03-17 NOTE — Telephone Encounter (Signed)
I am going to put this in as a refill request because I need to add the medication into epic from [facility].

## 2015-03-17 NOTE — Telephone Encounter (Signed)
Patient was last seen for an appointment related to this medication on 09/13/14 with no follow-up indicated and medication was last refilled then with 1 refill. [facility] and [facility] number is [phone].

## 2015-03-17 NOTE — Telephone Encounter (Signed)
E-Fax came through for refill on: Rx: Invokana Copy of Rx in basket

## 2015-03-18 MED ORDER — CANAGLIFLOZIN 100 MG PO TABS: 100.0000 mg | ORAL_TABLET | Freq: Every day | ORAL | Status: DC

## 2015-03-18 NOTE — Telephone Encounter (Signed)
Last GFR 68 Upcoming visit in August Rx approved

## 2015-04-03 DIAGNOSIS — I2510 Atherosclerotic heart disease of native coronary artery without angina pectoris: Secondary | ICD-10-CM | POA: Insufficient documentation

## 2015-04-03 DIAGNOSIS — E785 Hyperlipidemia, unspecified: Secondary | ICD-10-CM

## 2015-04-03 DIAGNOSIS — M8580 Other specified disorders of bone density and structure, unspecified site: Secondary | ICD-10-CM | POA: Insufficient documentation

## 2015-04-03 DIAGNOSIS — E119 Type 2 diabetes mellitus without complications: Secondary | ICD-10-CM | POA: Insufficient documentation

## 2015-04-03 DIAGNOSIS — I25119 Atherosclerotic heart disease of native coronary artery with unspecified angina pectoris: Secondary | ICD-10-CM

## 2015-04-03 DIAGNOSIS — M25559 Pain in unspecified hip: Secondary | ICD-10-CM

## 2015-04-03 DIAGNOSIS — I10 Essential (primary) hypertension: Secondary | ICD-10-CM

## 2015-04-07 ENCOUNTER — Ambulatory Visit (INDEPENDENT_AMBULATORY_CARE_PROVIDER_SITE_OTHER): Payer: BLUE CROSS/BLUE SHIELD | Admitting: Unknown Physician Specialty

## 2015-04-07 ENCOUNTER — Encounter: Payer: Self-pay | Admitting: Unknown Physician Specialty

## 2015-04-07 VITALS — BP 141/83 | HR 67 | Temp 97.7°F | Ht <= 58 in | Wt 151.2 lb

## 2015-04-07 DIAGNOSIS — Z0000 Encounter for general adult medical examination without abnormal findings: Secondary | ICD-10-CM

## 2015-04-07 DIAGNOSIS — E119 Type 2 diabetes mellitus without complications: Secondary | ICD-10-CM | POA: Diagnosis not present

## 2015-04-07 DIAGNOSIS — I10 Essential (primary) hypertension: Secondary | ICD-10-CM

## 2015-04-07 DIAGNOSIS — E785 Hyperlipidemia, unspecified: Secondary | ICD-10-CM | POA: Diagnosis not present

## 2015-04-07 LAB — BAYER DCA HB A1C WAIVED: HB A1C (BAYER DCA - WAIVED): 6.5 % (ref <7.0)

## 2015-04-07 LAB — MICROALBUMIN, URINE WAIVED
CREATININE, URINE WAIVED: 100 mg/dL (ref 10–300)
MICROALB, UR WAIVED: 30 mg/L — AB (ref 0–19)
Microalb/Creat Ratio: <30 mg/g (ref <30)

## 2015-04-07 MED ORDER — CANAGLIFLOZIN 100 MG PO TABS: 100.0000 mg | ORAL_TABLET | Freq: Every day | ORAL | Status: DC

## 2015-04-07 NOTE — Assessment & Plan Note (Signed)
Stable, continue present medications.   

## 2015-04-07 NOTE — Progress Notes (Signed)
BP 141/83 mmHg  Pulse 67  Temp(Src) 97.7 F (36.5 C)  Ht 4' 4.6" (1.336 m)  Wt 151 lb 3.2 oz (68.584 kg)  BMI 38.42 kg/m2  SpO2 97%  LMP  (LMP Unknown)   Subjective:    Patient ID: Mary Maynard, female    DOB: 10-16-1950, 64 y.o.   MRN: [id number]  HPI: Mary Maynard is a 64 y.o. female  Chief Complaint  Patient presents with  . Annual Exam  . Cough    pt stated she saw another doctor who gave her medicine but cant seem to get rid of the cough   States on July 10th she had a URI and tonsillitis.  She went back on the 29th but not sure what medications she was given.    She is better but still has a cough  1.  DIABETES Hypoglycemic episodes:  no  H8  Polydipsia/polyuria:  no  H8 Visual disturbance:  no  R2  Chest pain:  no  R4  Paresthesias:  no  R10  Glucose Monitoring:  yes   Accucheck frequency:  once daily  Fasting glucose:  130-140s  Patient reports retinal exam done.  Foot Exam: Foot Exam Done on 06/12/14 P1 Aspirin:  Aspirin Therapy Done on 08/25/10  2.  HYPERTENSION / HYPERLIPIDEMIA Patient is requesting refill(s). Satisfied with current treatment?  no H6  Duration of hypertension:  chronic  H4  BP monitoring frequency:  not checking.  H5  BP medication side effects:  no P1 Past BP meds:  none  P1  Duration of hyperlipidemia:  chronic  H4  Cholesterol medication side effects:  no P1  Cholesterol supplements:  none  P1 Past cholesterol medications:  none  P1 Medication compliance:  excellent compliance  P1  Aspirin:  Aspirin Therapy Done on 08/25/10 Recent stressors:  no  H6   Recurrent headaches:  no  R10 Visual changes:  no  R2  Palpitations:  no  R4  Dyspnea:  no  R5  Chest pain:  no  R4  Lower extremity edema:  no  R4  Dizzy/lightheaded:  no  R10     Relevant past medical, surgical, family and social history reviewed and updated as indicated. Interim medical history since our last visit reviewed. Allergies and medications reviewed and  updated.  Review of Systems  Unable to perform ROS Constitutional: Negative.   HENT: Negative.   Eyes: Negative.   Respiratory: Positive for cough.   Cardiovascular: Negative.   Gastrointestinal: Negative.   Endocrine: Negative.   Genitourinary: Negative.   Musculoskeletal: Negative.   Skin: Negative.   Allergic/Immunologic: Negative.   Neurological: Negative.   Hematological: Negative.   Psychiatric/Behavioral: Negative.   All other systems reviewed and are negative.   Per HPI unless specifically indicated above     Objective:    BP 141/83 mmHg  Pulse 67  Temp(Src) 97.7 F (36.5 C)  Ht 4' 4.6" (1.336 m)  Wt 151 lb 3.2 oz (68.584 kg)  BMI 38.42 kg/m2  SpO2 97%  LMP  (LMP Unknown)  Wt Readings from Last 3 Encounters:  04/07/15 151 lb 3.2 oz (68.584 kg)  01/27/15 155 lb (70.308 kg)  03/16/15 150 lb (68.04 kg)    Physical Exam  Constitutional: She is oriented to person, place, and time. She appears well-developed and well-nourished.  HENT:  Head: Normocephalic and atraumatic.  Eyes: Pupils are equal, round, and reactive to light. Right eye exhibits no discharge. Left eye exhibits no  discharge. No scleral icterus.  Neck: Normal range of motion. Neck supple. Carotid bruit is not present. No thyromegaly present.  Cardiovascular: Normal rate, regular rhythm and normal heart sounds.  Exam reveals no gallop and no friction rub.   No murmur heard. Pulmonary/Chest: Effort normal and breath sounds normal. No respiratory distress. She has no wheezes. She has no rales.  Abdominal: Soft. Bowel sounds are normal. There is no tenderness. There is no rebound.  Genitourinary: No breast swelling, tenderness or discharge.  Musculoskeletal: Normal range of motion.  Lymphadenopathy:    She has no cervical adenopathy.  Neurological: She is alert and oriented to person, place, and time.  Skin: Skin is warm, dry and intact. No rash noted.  Psychiatric: She has a normal mood and affect.  Her speech is normal and behavior is normal. Judgment and thought content normal. Cognition and memory are normal.      Assessment & Plan:   Problem List Items Addressed This Visit      Unprioritized   Hypertension   Relevant Orders   Comprehensive metabolic panel   Lipid Panel w/o Chol/HDL Ratio   Microalbumin, Urine Waived   Uric acid   Hyperlipidemia   Relevant Orders   Lipid Panel w/o Chol/HDL Ratio   Diabetes   Relevant Medications   canagliflozin (INVOKANA) 100 MG TABS tablet   Other Relevant Orders   Comprehensive metabolic panel   Bayer DCA Hb Z6X Waived   Microalbumin, Urine Waived    Other Visit Diagnoses    Routine general medical examination at a health care facility    -  Primary    Relevant Orders    CBC with Differential/Platelet    HIV antibody    TSH        Follow up plan: Return in about 6 months (around 10/08/2015).

## 2015-04-07 NOTE — Assessment & Plan Note (Signed)
Stable.  Continue present medications 

## 2015-04-08 ENCOUNTER — Encounter: Payer: Self-pay | Admitting: Unknown Physician Specialty

## 2015-04-08 LAB — COMPREHENSIVE METABOLIC PANEL
A/G RATIO: 1.7 (ref 1.1–2.5)
ALT: 11 IU/L (ref 0–32)
AST: 13 IU/L (ref 0–40)
Albumin: 4.5 g/dL (ref 3.6–4.8)
Alkaline Phosphatase: 61 IU/L (ref 39–117)
BILIRUBIN TOTAL: 0.5 mg/dL (ref 0.0–1.2)
BUN / CREAT RATIO: 18 (ref 11–26)
BUN: 18 mg/dL (ref 8–27)
CALCIUM: 10 mg/dL (ref 8.7–10.3)
CO2: 26 mmol/L (ref 18–29)
Chloride: 97 mmol/L (ref 97–108)
Creatinine, Ser: 0.99 mg/dL (ref 0.57–1.00)
GFR calc Af Amer: 70 mL/min/{1.73_m2} (ref >59)
GFR calc non Af Amer: 60 mL/min/{1.73_m2} (ref >59)
GLOBULIN, TOTAL: 2.7 g/dL (ref 1.5–4.5)
Glucose: 130 mg/dL — ABNORMAL HIGH (ref 65–99)
POTASSIUM: 4.4 mmol/L (ref 3.5–5.2)
SODIUM: 141 mmol/L (ref 134–144)
TOTAL PROTEIN: 7.2 g/dL (ref 6.0–8.5)

## 2015-04-08 LAB — LIPID PANEL W/O CHOL/HDL RATIO
CHOLESTEROL TOTAL: 135 mg/dL (ref 100–199)
HDL: 45 mg/dL (ref >39)
LDL Calculated: 70 mg/dL (ref 0–99)
Triglycerides: 98 mg/dL (ref 0–149)
VLDL CHOLESTEROL CAL: 20 mg/dL (ref 5–40)

## 2015-04-08 LAB — CBC WITH DIFFERENTIAL/PLATELET
BASOS ABS: 0 10*3/uL (ref 0.0–0.2)
Basos: 0 %
EOS (ABSOLUTE): 0.2 10*3/uL (ref 0.0–0.4)
EOS: 2 %
HEMATOCRIT: 41.6 % (ref 34.0–46.6)
Hemoglobin: 13.8 g/dL (ref 11.1–15.9)
Immature Grans (Abs): 0 10*3/uL (ref 0.0–0.1)
Immature Granulocytes: 0 %
LYMPHS ABS: 2.2 10*3/uL (ref 0.7–3.1)
Lymphs: 30 %
MCH: 27 pg (ref 26.6–33.0)
MCHC: 33.2 g/dL (ref 31.5–35.7)
MCV: 81 fL (ref 79–97)
MONOS ABS: 0.7 10*3/uL (ref 0.1–0.9)
Monocytes: 9 %
NEUTROS ABS: 4.2 10*3/uL (ref 1.4–7.0)
Neutrophils: 59 %
Platelets: 397 10*3/uL — ABNORMAL HIGH (ref 150–379)
RBC: 5.12 x10E6/uL (ref 3.77–5.28)
RDW: 14 % (ref 12.3–15.4)
WBC: 7.3 10*3/uL (ref 3.4–10.8)

## 2015-04-08 LAB — URIC ACID: URIC ACID: 5.8 mg/dL (ref 2.5–7.1)

## 2015-04-08 LAB — TSH: TSH: 3.42 u[IU]/mL (ref 0.450–4.500)

## 2015-04-08 LAB — HIV ANTIBODY (ROUTINE TESTING W REFLEX): HIV Screen 4th Generation wRfx: NONREACTIVE

## 2015-05-22 ENCOUNTER — Other Ambulatory Visit: Payer: Self-pay | Admitting: Unknown Physician Specialty

## 2015-05-22 DIAGNOSIS — Z1231 Encounter for screening mammogram for malignant neoplasm of breast: Secondary | ICD-10-CM

## 2015-05-23 ENCOUNTER — Ambulatory Visit
Admission: RE | Admit: 2015-05-23 | Discharge: 2015-05-23 | Disposition: A | Discharge status: Home / Self Care | Payer: BLUE CROSS/BLUE SHIELD | Source: Ambulatory Visit | Attending: Unknown Physician Specialty | Admitting: Unknown Physician Specialty

## 2015-05-23 DIAGNOSIS — Z1231 Encounter for screening mammogram for malignant neoplasm of breast: Secondary | ICD-10-CM | POA: Diagnosis present

## 2015-06-04 ENCOUNTER — Other Ambulatory Visit: Payer: Self-pay | Admitting: Unknown Physician Specialty

## 2015-07-27 ENCOUNTER — Other Ambulatory Visit: Payer: Self-pay | Admitting: Unknown Physician Specialty

## 2015-08-20 LAB — HM DIABETES EYE EXAM

## 2015-08-24 ENCOUNTER — Other Ambulatory Visit: Payer: Self-pay | Admitting: Unknown Physician Specialty

## 2015-08-24 NOTE — Telephone Encounter (Signed)
Creatinine and K+ from August 2016 reviewed; Rx approved in primary's absence

## 2015-09-10 ENCOUNTER — Other Ambulatory Visit: Payer: Self-pay | Admitting: Unknown Physician Specialty

## 2015-10-08 ENCOUNTER — Ambulatory Visit: Payer: BLUE CROSS/BLUE SHIELD | Admitting: Unknown Physician Specialty

## 2015-10-23 ENCOUNTER — Telehealth: Payer: Self-pay | Admitting: Unknown Physician Specialty

## 2015-10-23 NOTE — Telephone Encounter (Signed)
Patient returned call and confirmed the invokana. Can we give her a savings card?

## 2015-10-23 NOTE — Telephone Encounter (Signed)
Called and left patient a voicemail asking for her to please return my call. Mary Maynard, I called to confirm what medication patient is talking about. I believe it is her invokana. Can I give her a savings card if it the invokana she is talking about?

## 2015-10-23 NOTE — Telephone Encounter (Signed)
Pt's insurance doesn't kick in until March 1, she is out of her Invocate and was told it would be over $200 for her to get enough until March 1.  Can you please call her and see what we can do to help her?

## 2015-10-23 NOTE — Telephone Encounter (Signed)
Anyone who wants one, can have one.  Feel free to give them out.

## 2015-10-24 NOTE — Telephone Encounter (Signed)
Called and let patient know savings card would be available for her to pick up at the front desk.

## 2015-11-07 ENCOUNTER — Encounter: Payer: Self-pay | Admitting: Unknown Physician Specialty

## 2015-11-07 ENCOUNTER — Ambulatory Visit (INDEPENDENT_AMBULATORY_CARE_PROVIDER_SITE_OTHER): Payer: Commercial Managed Care - HMO | Admitting: Unknown Physician Specialty

## 2015-11-07 VITALS — BP 138/79 | HR 61 | Temp 97.4°F | Ht 62.7 in | Wt 151.8 lb

## 2015-11-07 DIAGNOSIS — I10 Essential (primary) hypertension: Secondary | ICD-10-CM

## 2015-11-07 DIAGNOSIS — E785 Hyperlipidemia, unspecified: Secondary | ICD-10-CM | POA: Diagnosis not present

## 2015-11-07 DIAGNOSIS — M5442 Lumbago with sciatica, left side: Secondary | ICD-10-CM | POA: Diagnosis not present

## 2015-11-07 DIAGNOSIS — E119 Type 2 diabetes mellitus without complications: Secondary | ICD-10-CM | POA: Diagnosis not present

## 2015-11-07 LAB — LIPID PANEL PICCOLO, WAIVED
CHOLESTEROL PICCOLO, WAIVED: 151 mg/dL (ref <200)
Chol/HDL Ratio Piccolo,Waive: 3 mg/dL
HDL CHOL PICCOLO, WAIVED: 51 mg/dL — AB (ref >59)
LDL CHOL CALC PICCOLO WAIVED: 84 mg/dL (ref <100)
Triglycerides Piccolo,Waived: 85 mg/dL (ref <150)
VLDL CHOL CALC PICCOLO,WAIVE: 17 mg/dL (ref <30)

## 2015-11-07 LAB — BAYER DCA HB A1C WAIVED: HB A1C: 6.6 % (ref <7.0)

## 2015-11-07 MED ORDER — METFORMIN HCL 1000 MG PO TABS: 1000.0000 mg | ORAL_TABLET | Freq: Two times a day (BID) | ORAL | Status: DC

## 2015-11-07 MED ORDER — PRAVASTATIN SODIUM 40 MG PO TABS: 40.0000 mg | ORAL_TABLET | Freq: Every day | ORAL | Status: DC

## 2015-11-07 MED ORDER — LOSARTAN POTASSIUM-HCTZ 50-12.5 MG PO TABS: 1.0000 | ORAL_TABLET | Freq: Every day | ORAL | Status: DC

## 2015-11-07 MED ORDER — CANAGLIFLOZIN 100 MG PO TABS: 100.0000 mg | ORAL_TABLET | Freq: Every day | ORAL | Status: DC

## 2015-11-07 NOTE — Assessment & Plan Note (Signed)
Stable, continue present medications.   

## 2015-11-07 NOTE — Assessment & Plan Note (Signed)
Treatment through Dr. Jonnie FinnerNaylor.  Pt would like to not get a MRI at this time or consider injections or surgery.  Will continue to follow up with Dr. Jonnie FinnerNaylor.

## 2015-11-07 NOTE — Progress Notes (Signed)
BP 138/79 mmHg  Pulse 61  Temp(Src) 97.4 F (36.3 C)  Ht 5' 2.7" (1.593 m)  Wt 151 lb 12.8 oz (68.856 kg)  BMI 27.13 kg/m2  SpO2 99%  LMP  (LMP Unknown)   Subjective:    Patient ID: Mary Maynard, female    DOB: [birth year], 65 y.o.   MRN: [id number]  HPI: Mary Maynard is a 65 y.o. female  Chief Complaint  Patient presents with  . Diabetes    pt states she had eye exam in December, will fax paper to Dr. Leonides Cave office  . Hyperlipidemia  . Hypertension   Diabetes:  Using medications without difficulties No hypoglycemic episodes No hyperglycemic episodes Feet problems: denies Blood Sugars averaging: 90-100 Eye exam within last year: yes  Hypertension:  Using medications without difficulty Average home BPs: doesn't take   Using medication without problems or lightheadedness No chest pain with exertion or shortness of breath No Edema   Elevated Cholesterol: Using medications without problems No Muscle aches Diet compliance: Eats a lot of vegetables. Mostly eats at home.  Exercise: Walks almost every day and some weight training in her house.   Sciatica Pt has been seeing a chiropractor for her back pain with left side sciatica.  She had previously gone to PT and seeing Dr. Jonnie Finner for the past year.    Relevant past medical, surgical, family and social history reviewed and updated as indicated. Interim medical history since our last visit reviewed. Allergies and medications reviewed and updated.  Review of Systems  Constitutional: Negative.   HENT: Negative.   Eyes: Negative.   Respiratory: Negative.   Cardiovascular: Negative.   Gastrointestinal: Negative.   Endocrine: Negative.   Genitourinary: Negative.   Musculoskeletal: Negative.   Skin: Negative.   Allergic/Immunologic: Negative.   Neurological: Negative.   Hematological: Negative.   Psychiatric/Behavioral: Negative.     Per HPI unless specifically indicated above     Objective:    BP  138/79 mmHg  Pulse 61  Temp(Src) 97.4 F (36.3 C)  Ht 5' 2.7" (1.593 m)  Wt 151 lb 12.8 oz (68.856 kg)  BMI 27.13 kg/m2  SpO2 99%  LMP  (LMP Unknown)  Wt Readings from Last 3 Encounters:  11/07/15 151 lb 12.8 oz (68.856 kg)  04/07/15 151 lb 3.2 oz (68.584 kg)  01/27/15 155 lb (70.308 kg)    Physical Exam  Constitutional: She is oriented to person, place, and time. She appears well-developed and well-nourished. No distress.  HENT:  Head: Normocephalic and atraumatic.  Eyes: Conjunctivae and lids are normal. Right eye exhibits no discharge. Left eye exhibits no discharge. No scleral icterus.  Neck: Normal range of motion. Neck supple. No JVD present. Carotid bruit is not present.  Cardiovascular: Normal rate, regular rhythm and normal heart sounds.   Pulmonary/Chest: Effort normal and breath sounds normal.  Abdominal: Normal appearance. There is no splenomegaly or hepatomegaly.  Musculoskeletal: Normal range of motion.  Neurological: She is alert and oriented to person, place, and time.  Skin: Skin is warm, dry and intact. No rash noted. No pallor.  Psychiatric: She has a normal mood and affect. Her behavior is normal. Judgment and thought content normal.   Diabetic Foot Exam - Simple   Simple Foot Form  Diabetic Foot exam was performed with the following findings:  Yes 11/07/2015 11:46 AM  Visual Inspection  No deformities, no ulcerations, no other skin breakdown bilaterally:  Yes  Sensation Testing  Intact to touch and monofilament  testing bilaterally:  Yes  Pulse Check  Posterior Tibialis and Dorsalis pulse intact bilaterally:  Yes  Comments         Assessment & Plan:   Problem List Items Addressed This Visit      Unprioritized   Hypertension    Stable, continue present medications.        Relevant Medications   losartan-hydrochlorothiazide (HYZAAR) 50-12.5 MG tablet   pravastatin (PRAVACHOL) 40 MG tablet   Other Relevant Orders   Comprehensive metabolic panel    Hyperlipidemia    Stable, continue present medications.        Relevant Medications   losartan-hydrochlorothiazide (HYZAAR) 50-12.5 MG tablet   pravastatin (PRAVACHOL) 40 MG tablet   Other Relevant Orders   Lipid Panel Piccolo, Waived   Low back pain with left-sided sciatica    Treatment through Dr. Jonnie FinnerNaylor.  Pt would like to not get a MRI at this time or consider injections or surgery.  Will continue to follow up with Dr. Jonnie FinnerNaylor.         Other Visit Diagnoses    Controlled type 2 diabetes mellitus without complication, without long-term current use of insulin (HCC)    -  Primary    Relevant Medications    canagliflozin (INVOKANA) 100 MG TABS tablet    losartan-hydrochlorothiazide (HYZAAR) 50-12.5 MG tablet    pravastatin (PRAVACHOL) 40 MG tablet    metFORMIN (GLUCOPHAGE) 1000 MG tablet    Other Relevant Orders    Bayer DCA Hb A1c Waived        Follow up plan: Return in about 6 months (around 05/09/2016).

## 2015-11-07 NOTE — Addendum Note (Signed)
Addended by: Gabriel CirriWICKER, Atalya Dano on: 11/07/2015 12:01 PM   Modules accepted: Orders

## 2015-11-08 LAB — COMPREHENSIVE METABOLIC PANEL
A/G RATIO: 2.1 (ref 1.1–2.5)
ALT: 9 IU/L (ref 0–32)
AST: 15 IU/L (ref 0–40)
Albumin: 4.8 g/dL (ref 3.6–4.8)
Alkaline Phosphatase: 45 IU/L (ref 39–117)
BUN/Creatinine Ratio: 16 (ref 11–26)
BUN: 14 mg/dL (ref 8–27)
Bilirubin Total: 0.6 mg/dL (ref 0.0–1.2)
CALCIUM: 9.8 mg/dL (ref 8.7–10.3)
CO2: 21 mmol/L (ref 18–29)
CREATININE: 0.87 mg/dL (ref 0.57–1.00)
Chloride: 96 mmol/L (ref 96–106)
GFR, EST AFRICAN AMERICAN: 81 mL/min/{1.73_m2} (ref >59)
GFR, EST NON AFRICAN AMERICAN: 71 mL/min/{1.73_m2} (ref >59)
GLOBULIN, TOTAL: 2.3 g/dL (ref 1.5–4.5)
Glucose: 126 mg/dL — ABNORMAL HIGH (ref 65–99)
POTASSIUM: 4.7 mmol/L (ref 3.5–5.2)
Sodium: 137 mmol/L (ref 134–144)
TOTAL PROTEIN: 7.1 g/dL (ref 6.0–8.5)

## 2015-11-12 ENCOUNTER — Other Ambulatory Visit: Payer: Self-pay | Admitting: Unknown Physician Specialty

## 2015-11-13 NOTE — Telephone Encounter (Signed)
Routing to provider  

## 2015-11-13 NOTE — Telephone Encounter (Signed)
Pt came in stated [facility] did not receive the RX for her Amlodipine. Please send ASAP. Pt has not taken this medication in a week.  [facility]   Thanks.

## 2015-11-13 NOTE — Telephone Encounter (Signed)
Patient called and said that [facility] has her amlodipine on hold for 90 days so they wont send it out until May. Patient would like a 90 day supply sent to [facility] until her prescription from [facility].

## 2015-11-14 ENCOUNTER — Other Ambulatory Visit: Payer: Self-pay | Admitting: Unknown Physician Specialty

## 2015-11-14 MED ORDER — AMLODIPINE BESYLATE 5 MG PO TABS: 5.0000 mg | ORAL_TABLET | Freq: Every day | ORAL | Status: DC

## 2015-11-24 ENCOUNTER — Telehealth: Payer: Self-pay

## 2015-11-24 NOTE — Telephone Encounter (Signed)
We got [facility] wanting to send the patient blood glucose testing supplies. I tried to call the patient to make sure she was really requesting this to be done but I left her a voicemail asking for her to please return my call.

## 2015-11-25 NOTE — Telephone Encounter (Signed)
Called and spoke to patient. She states she has never even heard of [facility] and that she just got supplies from [facility]. I told the patient I would shred the fax.

## 2015-12-02 ENCOUNTER — Telehealth: Payer: Self-pay | Admitting: Unknown Physician Specialty

## 2015-12-02 NOTE — Telephone Encounter (Signed)
Pt can stated she received a call stated she cancelled an RX for Lidocaine as she does not know what this is and knows nothing about it. Thanks.

## 2015-12-02 NOTE — Telephone Encounter (Signed)
Called and spoke to patient. We had gotten some paperwork from [facility] wanting to send the patient some medications. So I had called the patient and she said she never requested those medications so I told her I would shred the paperwork.

## 2015-12-10 ENCOUNTER — Ambulatory Visit (INDEPENDENT_AMBULATORY_CARE_PROVIDER_SITE_OTHER): Payer: Commercial Managed Care - HMO | Admitting: Unknown Physician Specialty

## 2015-12-10 ENCOUNTER — Encounter: Payer: Self-pay | Admitting: Unknown Physician Specialty

## 2015-12-10 VITALS — BP 146/80 | HR 98 | Temp 98.1°F | Ht 62.7 in | Wt 146.6 lb

## 2015-12-10 DIAGNOSIS — J069 Acute upper respiratory infection, unspecified: Secondary | ICD-10-CM | POA: Diagnosis not present

## 2015-12-10 DIAGNOSIS — R05 Cough: Secondary | ICD-10-CM | POA: Diagnosis not present

## 2015-12-10 DIAGNOSIS — R059 Cough, unspecified: Secondary | ICD-10-CM

## 2015-12-10 LAB — VERITOR FLU A/B WAIVED
INFLUENZA A: NEGATIVE
INFLUENZA B: NEGATIVE

## 2015-12-10 MED ORDER — BENZONATATE 100 MG PO CAPS: 100.0000 mg | ORAL_CAPSULE | Freq: Three times a day (TID) | ORAL | Status: DC | PRN

## 2015-12-10 NOTE — Progress Notes (Signed)
BP 146/80 mmHg  Pulse 98  Temp(Src) 98.1 F (36.7 C)  Ht 5' 2.7" (1.593 m)  Wt 146 lb 9.6 oz (66.497 kg)  BMI 26.20 kg/m2  SpO2 95%  LMP  (LMP Unknown)   Subjective:    Patient ID: Mary Maynard [place], female    DOB: [birth year], 65 y.o.   MRN: [id number]  HPI: Mary Maynard [place] is a 65 y.o. female  Chief Complaint  Patient presents with  . Cough    pt states she has had a cough for about 3 days   Cough This is a new problem. Episode onset: 3 days. The problem has been unchanged. The cough is non-productive. Associated symptoms include chills, headaches, nasal congestion, postnasal drip and rhinorrhea. Pertinent negatives include no chest pain, ear congestion, ear pain, fever, heartburn, hemoptysis, myalgias, sweats, weight loss or wheezing. Nothing aggravates the symptoms. She has tried nothing for the symptoms. The treatment provided no relief.    Relevant past medical, surgical, family and social history reviewed and updated as indicated. Interim medical history since our last visit reviewed. Allergies and medications reviewed and updated.  Review of Systems  Constitutional: Positive for chills. Negative for fever and weight loss.  HENT: Positive for postnasal drip and rhinorrhea. Negative for ear pain.   Respiratory: Positive for cough. Negative for hemoptysis and wheezing.   Cardiovascular: Negative for chest pain.  Gastrointestinal: Negative for heartburn.  Musculoskeletal: Negative for myalgias.  Neurological: Positive for headaches.    Per HPI unless specifically indicated above     Objective:    BP 146/80 mmHg  Pulse 98  Temp(Src) 98.1 F (36.7 C)  Ht 5' 2.7" (1.593 m)  Wt 146 lb 9.6 oz (66.497 kg)  BMI 26.20 kg/m2  SpO2 95%  LMP  (LMP Unknown)  Wt Readings from Last 3 Encounters:  12/10/15 146 lb 9.6 oz (66.497 kg)  11/07/15 151 lb 12.8 oz (68.856 kg)  04/07/15 151 lb 3.2 oz (68.584 kg)    Physical Exam  Constitutional: She is oriented to person, place, and  time. She appears well-developed and well-nourished. No distress.  HENT:  Head: Normocephalic and atraumatic.  Right Ear: Tympanic membrane and ear canal normal.  Left Ear: Tympanic membrane and ear canal normal.  Nose: Rhinorrhea present. Right sinus exhibits no maxillary sinus tenderness and no frontal sinus tenderness. Left sinus exhibits no maxillary sinus tenderness and no frontal sinus tenderness.  Mouth/Throat: Mucous membranes are normal. Posterior oropharyngeal erythema present.  Eyes: Conjunctivae and lids are normal. Right eye exhibits no discharge. Left eye exhibits no discharge. No scleral icterus.  Cardiovascular: Normal rate and regular rhythm.   Pulmonary/Chest: Effort normal and breath sounds normal. No respiratory distress.  Abdominal: Normal appearance. There is no splenomegaly or hepatomegaly.  Musculoskeletal: Normal range of motion.  Neurological: She is alert and oriented to person, place, and time.  Skin: Skin is intact. No rash noted. No pallor.  Psychiatric: She has a normal mood and affect. Her behavior is normal. Judgment and thought content normal.    Results for orders placed or performed in visit on 11/14/15  HM DIABETES EYE EXAM  Result Value Ref Range   HM Diabetic Eye Exam No Retinopathy No Retinopathy      Assessment & Plan:   Problem List Items Addressed This Visit    None    Visit Diagnoses    Cough    -  Primary    Rx for Tessalon Perles.  Supportive care  Relevant Orders    Influenza A & B (STAT)    URI (upper respiratory infection)        Supportive care.          Follow up plan: Return if symptoms worsen or fail to improve.

## 2015-12-10 NOTE — Patient Instructions (Signed)

## 2015-12-12 ENCOUNTER — Telehealth: Payer: Self-pay

## 2015-12-12 MED ORDER — HYDROCOD POLST-CPM POLST ER 10-8 MG/5ML PO SUER: 5.0000 mL | Freq: Two times a day (BID) | ORAL | Status: DC | PRN

## 2015-12-12 MED ORDER — AZITHROMYCIN 250 MG PO TABS: ORAL_TABLET | ORAL | Status: DC

## 2015-12-12 NOTE — Telephone Encounter (Signed)
Z pack written.  She will need to come and pick up the rx for the cough medication

## 2015-12-12 NOTE — Telephone Encounter (Signed)
She states she is still coughing a lot, tessalon perles are not helping. She is not sleeping, up coughing all night. Coughed so much her back is sore. Wants to know if something else can be called in.

## 2015-12-12 NOTE — Telephone Encounter (Signed)
Patient notified

## 2016-01-09 ENCOUNTER — Other Ambulatory Visit: Payer: Self-pay | Admitting: Unknown Physician Specialty

## 2016-01-09 MED ORDER — AMLODIPINE BESYLATE 5 MG PO TABS: 5.0000 mg | ORAL_TABLET | Freq: Every day | ORAL | Status: DC

## 2016-01-09 NOTE — Telephone Encounter (Signed)
Pt called stated she needs a new RX sent to [facility] as they did not receive an RX for Amlodoipine. Thanks.

## 2016-01-09 NOTE — Telephone Encounter (Signed)
Patient was seen 11/07/15. Pharmacy is [facility].

## 2016-01-15 ENCOUNTER — Other Ambulatory Visit: Payer: Self-pay | Admitting: Unknown Physician Specialty

## 2016-02-09 DIAGNOSIS — M791 Myalgia: Secondary | ICD-10-CM | POA: Diagnosis not present

## 2016-02-09 DIAGNOSIS — M9901 Segmental and somatic dysfunction of cervical region: Secondary | ICD-10-CM | POA: Diagnosis not present

## 2016-02-09 DIAGNOSIS — M5032 Other cervical disc degeneration, mid-cervical region, unspecified level: Secondary | ICD-10-CM | POA: Diagnosis not present

## 2016-02-09 DIAGNOSIS — M50321 Other cervical disc degeneration at C4-C5 level: Secondary | ICD-10-CM | POA: Diagnosis not present

## 2016-02-09 DIAGNOSIS — M5413 Radiculopathy, cervicothoracic region: Secondary | ICD-10-CM | POA: Diagnosis not present

## 2016-02-09 DIAGNOSIS — R202 Paresthesia of skin: Secondary | ICD-10-CM | POA: Diagnosis not present

## 2016-02-09 DIAGNOSIS — M9903 Segmental and somatic dysfunction of lumbar region: Secondary | ICD-10-CM | POA: Diagnosis not present

## 2016-02-09 DIAGNOSIS — M542 Cervicalgia: Secondary | ICD-10-CM | POA: Diagnosis not present

## 2016-02-09 DIAGNOSIS — M961 Postlaminectomy syndrome, not elsewhere classified: Secondary | ICD-10-CM | POA: Diagnosis not present

## 2016-02-10 DIAGNOSIS — M9903 Segmental and somatic dysfunction of lumbar region: Secondary | ICD-10-CM | POA: Diagnosis not present

## 2016-02-10 DIAGNOSIS — M5032 Other cervical disc degeneration, mid-cervical region, unspecified level: Secondary | ICD-10-CM | POA: Diagnosis not present

## 2016-02-10 DIAGNOSIS — M791 Myalgia: Secondary | ICD-10-CM | POA: Diagnosis not present

## 2016-02-10 DIAGNOSIS — M5413 Radiculopathy, cervicothoracic region: Secondary | ICD-10-CM | POA: Diagnosis not present

## 2016-02-10 DIAGNOSIS — M9901 Segmental and somatic dysfunction of cervical region: Secondary | ICD-10-CM | POA: Diagnosis not present

## 2016-02-10 DIAGNOSIS — M542 Cervicalgia: Secondary | ICD-10-CM | POA: Diagnosis not present

## 2016-02-10 DIAGNOSIS — R202 Paresthesia of skin: Secondary | ICD-10-CM | POA: Diagnosis not present

## 2016-02-10 DIAGNOSIS — M50321 Other cervical disc degeneration at C4-C5 level: Secondary | ICD-10-CM | POA: Diagnosis not present

## 2016-02-10 DIAGNOSIS — R69 Illness, unspecified: Secondary | ICD-10-CM | POA: Diagnosis not present

## 2016-02-11 DIAGNOSIS — M50321 Other cervical disc degeneration at C4-C5 level: Secondary | ICD-10-CM | POA: Diagnosis not present

## 2016-02-11 DIAGNOSIS — R69 Illness, unspecified: Secondary | ICD-10-CM | POA: Diagnosis not present

## 2016-02-11 DIAGNOSIS — M791 Myalgia: Secondary | ICD-10-CM | POA: Diagnosis not present

## 2016-02-11 DIAGNOSIS — R202 Paresthesia of skin: Secondary | ICD-10-CM | POA: Diagnosis not present

## 2016-02-11 DIAGNOSIS — M5032 Other cervical disc degeneration, mid-cervical region, unspecified level: Secondary | ICD-10-CM | POA: Diagnosis not present

## 2016-02-11 DIAGNOSIS — M542 Cervicalgia: Secondary | ICD-10-CM | POA: Diagnosis not present

## 2016-02-11 DIAGNOSIS — M9901 Segmental and somatic dysfunction of cervical region: Secondary | ICD-10-CM | POA: Diagnosis not present

## 2016-02-11 DIAGNOSIS — M5413 Radiculopathy, cervicothoracic region: Secondary | ICD-10-CM | POA: Diagnosis not present

## 2016-02-11 DIAGNOSIS — M9903 Segmental and somatic dysfunction of lumbar region: Secondary | ICD-10-CM | POA: Diagnosis not present

## 2016-03-16 ENCOUNTER — Other Ambulatory Visit: Payer: Self-pay | Admitting: Unknown Physician Specialty

## 2016-04-27 ENCOUNTER — Encounter: Payer: Self-pay | Admitting: Family Medicine

## 2016-04-27 ENCOUNTER — Ambulatory Visit (INDEPENDENT_AMBULATORY_CARE_PROVIDER_SITE_OTHER): Payer: Commercial Managed Care - HMO | Admitting: Family Medicine

## 2016-04-27 VITALS — BP 116/75 | HR 61 | Temp 97.7°F | Wt 154.0 lb

## 2016-04-27 DIAGNOSIS — M7989 Other specified soft tissue disorders: Secondary | ICD-10-CM | POA: Diagnosis not present

## 2016-04-27 DIAGNOSIS — M79609 Pain in unspecified limb: Secondary | ICD-10-CM | POA: Diagnosis not present

## 2016-04-27 DIAGNOSIS — E119 Type 2 diabetes mellitus without complications: Secondary | ICD-10-CM | POA: Diagnosis not present

## 2016-04-27 DIAGNOSIS — R2242 Localized swelling, mass and lump, left lower limb: Secondary | ICD-10-CM | POA: Diagnosis not present

## 2016-04-27 NOTE — Progress Notes (Signed)
BP 116/75   Pulse 61   Temp 97.7 F (36.5 C)   Wt 154 lb (69.9 kg)   LMP  (LMP Unknown)   SpO2 99%   BMI 27.54 kg/m    Subjective:    Patient ID: Mary Maynard, female    DOB: [birth year], 65 y.o.   MRN: [id number]  HPI: Mary Maynard is a 65 y.o. female  Chief Complaint  Patient presents with  . Other    knot on left thigh x approx a week. Somewhat painful to the touch. She said she's had some numbness and tingling in her leg and foot. States she's had a blood clot before.   Patient presents with 1 week history of a sore mass on inside of left leg. First noticed while putting lotion on last week. Has not changed since first noticed. Does have some calf tenderness. Hx of left popliteal DVT in 2014. Completed several months of anticoagulation at that time. Now taking baby aspirin. States this feels very similar to her DVT. Denies fever or chills, redness or swelling of the area.  No recent long travels, surgeries, sedentary lifestyle, or smoking.    Relevant past medical, surgical, family and social history reviewed and updated as indicated. Interim medical history since our last visit reviewed. Allergies and medications reviewed and updated.  Review of Systems  Constitutional: Negative.   Respiratory: Negative.  Negative for chest tightness and shortness of breath.   Cardiovascular: Negative for chest pain.  Gastrointestinal: Negative.   Musculoskeletal:       LLE pain related to small mass  Skin: Negative.   Neurological: Negative.   Psychiatric/Behavioral: Negative.     Per HPI unless specifically indicated above     Objective:    BP 116/75   Pulse 61   Temp 97.7 F (36.5 C)   Wt 154 lb (69.9 kg)   LMP  (LMP Unknown)   SpO2 99%   BMI 27.54 kg/m   Wt Readings from Last 3 Encounters:  04/27/16 154 lb (69.9 kg)  12/10/15 146 lb 9.6 oz (66.5 kg)  11/07/15 151 lb 12.8 oz (68.9 kg)    Physical Exam  Constitutional: She is oriented to person, place, and time.  She appears well-developed and well-nourished. No distress.  HENT:  Head: Atraumatic.  Eyes: Conjunctivae are normal. No scleral icterus.  Neck: Normal range of motion. Neck supple.  Cardiovascular: Normal rate, normal heart sounds and intact distal pulses.   Pulmonary/Chest: Effort normal and breath sounds normal. No respiratory distress.  Musculoskeletal: Normal range of motion. She exhibits tenderness (TTP over pea sized, hard mass on medial left thigh). She exhibits no edema.  Mild left calf tenderness to palpation Pea sized, solid mobile mass. No redness, swelling, or edema to the area  Neurological: She is alert and oriented to person, place, and time.  Skin: Skin is warm and dry. No erythema.  Psychiatric: She has a normal mood and affect. Her behavior is normal.  Nursing note and vitals reviewed.     Assessment & Plan:   Problem List Items Addressed This Visit    None    Visit Diagnoses    Leg mass, left    -  Primary   Not characteristic for DVT, but given hx, u/s scheduled for this afternoon with [facility] to r/o DVT. Will await results.    Relevant Orders   Ambulatory referral to Vascular Surgery       Follow up plan:  Return if symptoms worsen or fail to improve.

## 2016-04-27 NOTE — Patient Instructions (Signed)
Follow up as needed

## 2016-04-29 ENCOUNTER — Encounter (INDEPENDENT_AMBULATORY_CARE_PROVIDER_SITE_OTHER): Payer: Self-pay

## 2016-05-11 DIAGNOSIS — M50321 Other cervical disc degeneration at C4-C5 level: Secondary | ICD-10-CM | POA: Diagnosis not present

## 2016-05-11 DIAGNOSIS — M9903 Segmental and somatic dysfunction of lumbar region: Secondary | ICD-10-CM | POA: Diagnosis not present

## 2016-05-11 DIAGNOSIS — R69 Illness, unspecified: Secondary | ICD-10-CM | POA: Diagnosis not present

## 2016-05-11 DIAGNOSIS — M5032 Other cervical disc degeneration, mid-cervical region, unspecified level: Secondary | ICD-10-CM | POA: Diagnosis not present

## 2016-05-11 DIAGNOSIS — M791 Myalgia: Secondary | ICD-10-CM | POA: Diagnosis not present

## 2016-05-11 DIAGNOSIS — R202 Paresthesia of skin: Secondary | ICD-10-CM | POA: Diagnosis not present

## 2016-05-11 DIAGNOSIS — M5413 Radiculopathy, cervicothoracic region: Secondary | ICD-10-CM | POA: Diagnosis not present

## 2016-05-11 DIAGNOSIS — M9901 Segmental and somatic dysfunction of cervical region: Secondary | ICD-10-CM | POA: Diagnosis not present

## 2016-05-11 DIAGNOSIS — M542 Cervicalgia: Secondary | ICD-10-CM | POA: Diagnosis not present

## 2016-05-19 ENCOUNTER — Other Ambulatory Visit: Payer: Self-pay | Admitting: Unknown Physician Specialty

## 2016-05-25 ENCOUNTER — Ambulatory Visit (INDEPENDENT_AMBULATORY_CARE_PROVIDER_SITE_OTHER): Payer: Commercial Managed Care - HMO | Admitting: Unknown Physician Specialty

## 2016-05-25 ENCOUNTER — Encounter: Payer: Self-pay | Admitting: Unknown Physician Specialty

## 2016-05-25 VITALS — BP 128/76 | HR 62 | Temp 97.7°F | Ht 62.8 in | Wt 152.8 lb

## 2016-05-25 DIAGNOSIS — Z23 Encounter for immunization: Secondary | ICD-10-CM

## 2016-05-25 DIAGNOSIS — E2839 Other primary ovarian failure: Secondary | ICD-10-CM | POA: Diagnosis not present

## 2016-05-25 DIAGNOSIS — I10 Essential (primary) hypertension: Secondary | ICD-10-CM

## 2016-05-25 DIAGNOSIS — Z1239 Encounter for other screening for malignant neoplasm of breast: Secondary | ICD-10-CM

## 2016-05-25 DIAGNOSIS — Z0000 Encounter for general adult medical examination without abnormal findings: Secondary | ICD-10-CM

## 2016-05-25 DIAGNOSIS — E785 Hyperlipidemia, unspecified: Secondary | ICD-10-CM | POA: Diagnosis not present

## 2016-05-25 DIAGNOSIS — E119 Type 2 diabetes mellitus without complications: Secondary | ICD-10-CM | POA: Diagnosis not present

## 2016-05-25 LAB — MICROALBUMIN, URINE WAIVED
Creatinine, Urine Waived: 100 mg/dL (ref 10–300)
MICROALB, UR WAIVED: 30 mg/L — AB (ref 0–19)
Microalb/Creat Ratio: <30 mg/g (ref <30)

## 2016-05-25 LAB — BAYER DCA HB A1C WAIVED: HB A1C: 6.8 % (ref <7.0)

## 2016-05-25 NOTE — Patient Instructions (Addendum)

## 2016-05-25 NOTE — Assessment & Plan Note (Signed)
Hgb A1 6.8.  Continue present medication

## 2016-05-25 NOTE — Assessment & Plan Note (Signed)
Stable, continue present medications.   

## 2016-05-25 NOTE — Assessment & Plan Note (Signed)
Check Lipid panel 

## 2016-05-25 NOTE — Progress Notes (Signed)
BP 128/76 (BP Location: Left Arm, Patient Position: Sitting, Cuff Size: Large)   Pulse 62   Temp 97.7 F (36.5 C)   Ht 5' 2.8" (1.595 m)   Wt 152 lb 12.8 oz (69.3 kg)   LMP  (LMP Unknown)   SpO2 97%   BMI 27.24 kg/m    Subjective:    Patient ID: Mary Maynard, female    DOB: [birth year], 65 y.o.   MRN: [id number]  HPI: Mary Maynard is a 65 y.o. female  Chief Complaint  Patient presents with  . Medicare Wellness   Functional Status Survey: Is the patient deaf or have difficulty hearing?: No Does the patient have difficulty seeing, even when wearing glasses/contacts?: No Does the patient have difficulty concentrating, remembering, or making decisions?: No Does the patient have difficulty walking or climbing stairs?: No Does the patient have difficulty dressing or bathing?: No Does the patient have difficulty doing errands alone such as visiting a doctor's office or shopping?: No  Fall Risk  05/25/2016 04/27/2016  Falls in the past year? No No   Depression screen [facility] 05/25/2016 04/07/2015  Decreased Interest 0 1  Down, Depressed, Hopeless 0 1  PHQ - 2 Score 0 2    Diabetes: Using medications without difficulties No hypoglycemic episodes No hyperglycemic episodes Feet problems: none Blood Sugars averaging: "pretty good" eye exam within last year Last Hgb A1C: 6.6  Hypertension  Using medications without difficulty Average home BPs: not checking  Using medication without problems or lightheadedness No chest pain with exertion or shortness of breath No Edema  Elevated Cholesterol Using medications without problems No Muscle aches  Diet: Watches her diet Exercise: Walks  Relevant past medical, surgical, family and social history reviewed and updated as indicated. Interim medical history since our last visit reviewed. Allergies and medications reviewed and updated.  Review of Systems  Constitutional: Negative.   HENT: Negative.   Eyes: Negative.     Respiratory: Negative.   Cardiovascular: Negative.   Gastrointestinal: Negative.   Endocrine: Negative.   Genitourinary: Negative.   Musculoskeletal:       Low back pain and right hand pain  Skin: Negative.   Allergic/Immunologic: Negative.   Neurological: Negative.   Hematological: Negative.   Psychiatric/Behavioral: Negative.    Per HPI unless specifically indicated above    Objective:    BP 128/76 (BP Location: Left Arm, Patient Position: Sitting, Cuff Size: Large)   Pulse 62   Temp 97.7 F (36.5 C)   Ht 5' 2.8" (1.595 m)   Wt 152 lb 12.8 oz (69.3 kg)   LMP  (LMP Unknown)   SpO2 97%   BMI 27.24 kg/m   Wt Readings from Last 3 Encounters:  05/25/16 152 lb 12.8 oz (69.3 kg)  04/27/16 154 lb (69.9 kg)  12/10/15 146 lb 9.6 oz (66.5 kg)    Physical Exam  Constitutional: She is oriented to person, place, and time. She appears well-developed and well-nourished.  HENT:  Head: Normocephalic and atraumatic.  Eyes: Pupils are equal, round, and reactive to light. Right eye exhibits no discharge. Left eye exhibits no discharge. No scleral icterus.  Neck: Normal range of motion. Neck supple. Carotid bruit is not present. No thyromegaly present.  Cardiovascular: Normal rate, regular rhythm and normal heart sounds.  Exam reveals no gallop and no friction rub.   No murmur heard. Pulmonary/Chest: Effort normal and breath sounds normal. No respiratory distress. She has no wheezes. She has no rales.  Abdominal:  Soft. Bowel sounds are normal. There is no tenderness. There is no rebound.  Genitourinary: No breast swelling, tenderness or discharge.  Musculoskeletal: Normal range of motion.  Lymphadenopathy:    She has no cervical adenopathy.  Neurological: She is alert and oriented to person, place, and time.  Skin: Skin is warm, dry and intact. No rash noted.  Psychiatric: She has a normal mood and affect. Her speech is normal and behavior is normal. Judgment and thought content normal.  Cognition and memory are normal.  Nursing note and vitals reviewed.   Results for orders placed or performed in visit on 12/10/15  Influenza A & B (STAT)  Result Value Ref Range   Influenza A Negative Negative   Influenza B Negative Negative      Assessment & Plan:   Problem List Items Addressed This Visit      Unprioritized   Diabetes (HCC)    Hgb A1 6.8.  Continue present medication      Relevant Orders   Comprehensive metabolic panel   Bayer DCA Hb Z6X Waived   Lipid Panel w/o Chol/HDL Ratio   Microalbumin, Urine Waived   Hyperlipidemia    Check Lipid panel      Hypertension    Stable, continue present medications.         Other Visit Diagnoses    Immunization due    -  Primary   Relevant Orders   Flu vaccine HIGH DOSE PF (Completed)   Routine general medical examination at a health care facility       Relevant Orders   EKG 12-Lead (Completed)   Screening for breast cancer       Relevant Orders   MM DIGITAL SCREENING BILATERAL   Ovarian failure       Relevant Orders   DG Bone Density       Follow up plan: Return in about 6 months (around 11/22/2016).

## 2016-05-26 ENCOUNTER — Encounter: Payer: Self-pay | Admitting: Unknown Physician Specialty

## 2016-05-26 LAB — COMPREHENSIVE METABOLIC PANEL
ALK PHOS: 48 IU/L (ref 39–117)
ALT: 12 IU/L (ref 0–32)
AST: 15 IU/L (ref 0–40)
Albumin/Globulin Ratio: 1.8 (ref 1.2–2.2)
Albumin: 4.4 g/dL (ref 3.6–4.8)
BUN/Creatinine Ratio: 17 (ref 12–28)
BUN: 15 mg/dL (ref 8–27)
Bilirubin Total: 0.7 mg/dL (ref 0.0–1.2)
CALCIUM: 9.7 mg/dL (ref 8.7–10.3)
CO2: 26 mmol/L (ref 18–29)
CREATININE: 0.89 mg/dL (ref 0.57–1.00)
Chloride: 102 mmol/L (ref 96–106)
GFR calc Af Amer: 79 mL/min/{1.73_m2} (ref >59)
GFR, EST NON AFRICAN AMERICAN: 68 mL/min/{1.73_m2} (ref >59)
GLOBULIN, TOTAL: 2.5 g/dL (ref 1.5–4.5)
GLUCOSE: 127 mg/dL — AB (ref 65–99)
Potassium: 4.3 mmol/L (ref 3.5–5.2)
SODIUM: 143 mmol/L (ref 134–144)
Total Protein: 6.9 g/dL (ref 6.0–8.5)

## 2016-05-26 LAB — LIPID PANEL W/O CHOL/HDL RATIO
CHOLESTEROL TOTAL: 144 mg/dL (ref 100–199)
HDL: 53 mg/dL (ref >39)
LDL CALC: 80 mg/dL (ref 0–99)
TRIGLYCERIDES: 55 mg/dL (ref 0–149)
VLDL CHOLESTEROL CAL: 11 mg/dL (ref 5–40)

## 2016-06-23 ENCOUNTER — Ambulatory Visit: Payer: BLUE CROSS/BLUE SHIELD

## 2016-07-18 ENCOUNTER — Emergency Department
Admission: EM | Admit: 2016-07-18 | Discharge: 2016-07-18 | Disposition: A | Discharge status: Home / Self Care | Payer: Commercial Managed Care - HMO | Attending: Emergency Medicine | Admitting: Emergency Medicine

## 2016-07-18 ENCOUNTER — Encounter: Payer: Self-pay | Admitting: Emergency Medicine

## 2016-07-18 DIAGNOSIS — I10 Essential (primary) hypertension: Secondary | ICD-10-CM | POA: Insufficient documentation

## 2016-07-18 DIAGNOSIS — I2510 Atherosclerotic heart disease of native coronary artery without angina pectoris: Secondary | ICD-10-CM | POA: Diagnosis not present

## 2016-07-18 DIAGNOSIS — Z7984 Long term (current) use of oral hypoglycemic drugs: Secondary | ICD-10-CM | POA: Insufficient documentation

## 2016-07-18 DIAGNOSIS — E119 Type 2 diabetes mellitus without complications: Secondary | ICD-10-CM | POA: Diagnosis not present

## 2016-07-18 DIAGNOSIS — J069 Acute upper respiratory infection, unspecified: Secondary | ICD-10-CM | POA: Insufficient documentation

## 2016-07-18 DIAGNOSIS — Z79899 Other long term (current) drug therapy: Secondary | ICD-10-CM | POA: Insufficient documentation

## 2016-07-18 DIAGNOSIS — Z7982 Long term (current) use of aspirin: Secondary | ICD-10-CM | POA: Insufficient documentation

## 2016-07-18 DIAGNOSIS — B9789 Other viral agents as the cause of diseases classified elsewhere: Secondary | ICD-10-CM

## 2016-07-18 DIAGNOSIS — R05 Cough: Secondary | ICD-10-CM | POA: Diagnosis present

## 2016-07-18 MED ORDER — LORATADINE 10 MG PO TABS: 10.0000 mg | ORAL_TABLET | Freq: Every day | ORAL | 0 refills | Status: AC

## 2016-07-18 MED ORDER — BENZONATATE 100 MG PO CAPS: 100.0000 mg | ORAL_CAPSULE | Freq: Three times a day (TID) | ORAL | 0 refills | Status: DC | PRN

## 2016-07-18 NOTE — ED Provider Notes (Signed)
[facility] Emergency Department Provider Note  ____________________________________________  Time seen: Approximately 10:57 AM  I have reviewed the triage vital signs and the nursing notes.   HISTORY  Chief Complaint Cough    HPI Mary Maynard is a 65 y.o. female, NAD, presents to the emergency department with three-day history of runny nose, scratchy throat and cough. Has been taking Tylenol and aspirin at home without relief of symptoms. Has not been taking anything for current symptoms. Denies any production to the cough. Has not had any chest pain, shortness breath or wheezing. Denies abdominal pain, nausea or vomiting. Has had decreased appetite but no fatigue, numbness, weakness or tingling. No body aches or joint swelling. Has not noted any rashes. Denies any sick contacts.   Past Medical History:  Diagnosis Date  . Abdominal pain   . CAD (coronary artery disease)   . Diabetes mellitus   . Headache(784.0)    since accident  . Hip pain   . History of DVT (deep vein thrombosis) 05/2013   left leg  . Hyperlipidemia   . Hypertension   . Osteopenia   . PONV (postoperative nausea and vomiting)    a little    Patient Active Problem List   Diagnosis Date Noted  . Low back pain with left-sided sciatica 11/07/2015  . CAD (coronary artery disease) 04/03/2015  . Osteopenia 04/03/2015  . Hip pain 04/03/2015  . Hypertension 04/03/2015  . Hyperlipidemia 04/03/2015  . Diabetes ([place]) 04/03/2015    Past Surgical History:  Procedure Laterality Date  . ABDOMINAL HYSTERECTOMY     partial  . ANTERIOR CERVICAL DECOMP/DISCECTOMY FUSION  03/24/2012   Procedure: ANTERIOR CERVICAL DECOMPRESSION/DISCECTOMY FUSION 1 LEVEL;  Surgeon: Faythe Ghee, MD;  Location: [facility];  Service: Neurosurgery;  Laterality: Bilateral;  Cervical five-six anterior cervical discectomy with fusion  . BREAST BIOPSY Left    1990's negative  . BREAST SURGERY     Lumpectomy-   . CARDIAC CATHETERIZATION    . TUBAL LIGATION      Prior to Admission medications   Medication Sig Start Date End Date Taking? Authorizing Provider  ACCU-CHEK AVIVA PLUS test strip  11/13/15   Historical Provider, MD  ACCU-CHEK SOFTCLIX LANCETS lancets  11/13/15   Historical Provider, MD  amLODipine (NORVASC) 5 MG tablet TAKE 1 TABLET EVERY DAY 05/19/16   Guadalupe Maple, MD  aspirin EC 81 MG tablet Take 81 mg by mouth daily.    Historical Provider, MD  benzonatate (TESSALON PERLES) 100 MG capsule Take 1 capsule (100 mg total) by mouth 3 (three) times daily as needed for cough. 07/18/16 07/18/17  Izel Eisenhardt L Keyasia Jolliff, PA-C  Blood Glucose Monitoring Suppl (ACCU-CHEK AVIVA PLUS) w/Device KIT USE EVERY DAY 01/15/16   Kathrine Haddock, NP  canagliflozin (INVOKANA) 100 MG TABS tablet Take 1 tablet (100 mg total) by mouth daily. 11/07/15   Kathrine Haddock, NP  loratadine (CLARITIN) 10 MG tablet Take 1 tablet (10 mg total) by mouth daily. 07/18/16   Casimer Russett L Preet Perrier, PA-C  losartan-hydrochlorothiazide (HYZAAR) 50-12.5 MG tablet TAKE 1 TABLET EVERY DAY 05/19/16   Guadalupe Maple, MD  metFORMIN (GLUCOPHAGE) 1000 MG tablet TAKE 1 TABLET TWICE DAILY WITH MEALS 05/19/16   Guadalupe Maple, MD  pravastatin (PRAVACHOL) 40 MG tablet TAKE 1 TABLET EVERY DAY 05/19/16   Guadalupe Maple, MD    Allergies Percocet [oxycodone-acetaminophen]; Codeine; Januvia [sitagliptin]; Lyrica [pregabalin]; Prednisone; and Tramadol  Family History  Problem Relation Age of Onset  .  Hypertension Mother   . Diabetes Mother   . Alcohol abuse Father   . Heart attack Father   . Alzheimer's disease Sister   . Diabetes Sister   . Stroke Paternal Grandmother   . Diabetes Sister   . Diabetes Sister   . Diabetes Sister   . Diabetes Sister   . Diabetes Sister   . Cancer Brother     Social History Social History  Substance Use Topics  . Smoking status: Never Smoker  . Smokeless tobacco: Never Used  . Alcohol use No     Review of Systems   Constitutional: Positive decreased appetite. No fever/chills Eyes: No visual changes. No discharge ENT: Positive nasal congestion, runny nose, scratchy sore throat. No ear pain, ear drainage, sinus pressure Cardiovascular: No chest pain. Respiratory: Positive dry, nonproductive cough. No shortness of breath. No wheezing.  Gastrointestinal: No abdominal pain.  No nausea, vomiting.   Musculoskeletal: Negative for general myalgias or joint swelling.  Skin: Negative for rash. Neurological: Negative for headaches, focal weakness or numbness. No tingling. 10-point ROS otherwise negative.  ____________________________________________   PHYSICAL EXAM:  VITAL SIGNS: ED Triage Vitals [07/18/16 1030]  Enc Vitals Group     BP      Pulse      Resp      Temp      Temp src      SpO2      Weight 152 lb (68.9 kg)     Height      Head Circumference      Peak Flow      Pain Score      Pain Loc      Pain Edu?      Excl. in [place]?      Constitutional: Alert and oriented. Well appearing and in no acute distress. Eyes: Conjunctivae are normal without icterus or injection Head: Atraumatic. ENT:      Ears: TMs visualized bilaterally with mild serous effusion but no bulging, erythema or perforation.      Nose: Mild congestion with trace clear rhinorrhea. Turbinates bilaterally are injected.      Mouth/Throat: Mucous membranes are moist. Pharynx with mild injection and clear postnasal drip. No pharyngeal swelling or exudate. Airway is patent. Uvula is midline. Neck: No stridor, carotid bruits. Supple with full range of motion. Hematological/Lymphatic/Immunilogical: No cervical lymphadenopathy. Cardiovascular: Normal rate, regular rhythm. Normal S1 and S2.  Good peripheral circulation. Respiratory: Normal respiratory effort without tachypnea or retractions. Lungs CTAB with breath sounds noted in all lung fields. No wheeze, rhonchi, rales. Neurologic:  Normal speech and language. No gross focal  neurologic deficits are appreciated.  Skin:  Skin is warm, dry and intact. No rash noted. Psychiatric: Mood and affect are normal. Speech and behavior are normal. Patient exhibits appropriate insight and judgement.   ____________________________________________   LABS  None ____________________________________________  EKG  None ____________________________________________  RADIOLOGY  None ____________________________________________    PROCEDURES  Procedure(s) performed: None   Procedures   Medications - No data to display   ____________________________________________   INITIAL IMPRESSION / ASSESSMENT AND PLAN / ED COURSE  Pertinent labs & imaging results that were available during my care of the patient were reviewed by me and considered in my medical decision making (see chart for details).  Clinical Course as of Jul 18 1128  Nancy Fetter Jul 18, 2016  1110 Waiting on vital signs to be taken and documented.  [JH]    Clinical Course User Index [JH] [place],  PA-C    Patient's diagnosis is consistent with Viral URI with cough. Patient will be discharged home with prescriptions for Tessalon Perles and loratadine to take as directed. Patient may continue to use over-the-counter Tylenol as needed. Patient is to follow up with her primary care provider if symptoms persist past this treatment course. Patient is given ED precautions to return to the ED for any worsening or new symptoms.    ____________________________________________  FINAL CLINICAL IMPRESSION(S) / ED DIAGNOSES  Final diagnoses:  Viral URI with cough      NEW MEDICATIONS STARTED DURING THIS VISIT:  New Prescriptions   BENZONATATE (TESSALON PERLES) 100 MG CAPSULE    Take 1 capsule (100 mg total) by mouth 3 (three) times daily as needed for cough.   LORATADINE (CLARITIN) 10 MG TABLET    Take 1 tablet (10 mg total) by mouth daily.         Braxton Feathers, PA-C 07/18/16 [place], MD 07/18/16 [phone]

## 2016-07-18 NOTE — ED Triage Notes (Signed)
Presents with cough and some body aches  Sx's became worse yesterday

## 2016-07-18 NOTE — ED Notes (Signed)
NAD noted at time of D/C. Pt denies questions or concerns. Pt ambulatory to the lobby at this time.  

## 2016-07-21 ENCOUNTER — Ambulatory Visit (INDEPENDENT_AMBULATORY_CARE_PROVIDER_SITE_OTHER): Payer: Commercial Managed Care - HMO | Admitting: Family Medicine

## 2016-07-21 ENCOUNTER — Encounter: Payer: Self-pay | Admitting: Family Medicine

## 2016-07-21 ENCOUNTER — Other Ambulatory Visit: Payer: Self-pay | Admitting: Family Medicine

## 2016-07-21 ENCOUNTER — Ambulatory Visit: Payer: Commercial Managed Care - HMO | Admitting: Family Medicine

## 2016-07-21 VITALS — BP 108/60 | HR 70 | Temp 98.4°F | Wt 157.0 lb

## 2016-07-21 DIAGNOSIS — B9789 Other viral agents as the cause of diseases classified elsewhere: Secondary | ICD-10-CM

## 2016-07-21 DIAGNOSIS — J069 Acute upper respiratory infection, unspecified: Secondary | ICD-10-CM | POA: Diagnosis not present

## 2016-07-21 MED ORDER — BENZONATATE 100 MG PO CAPS: 200.0000 mg | ORAL_CAPSULE | Freq: Three times a day (TID) | ORAL | 0 refills | Status: DC | PRN

## 2016-07-21 MED ORDER — DM-GUAIFENESIN ER 30-600 MG PO TB12: 1.0000 | ORAL_TABLET | Freq: Two times a day (BID) | ORAL | 0 refills | Status: DC

## 2016-07-21 NOTE — Progress Notes (Signed)
   BP 108/60   Pulse 70   Temp 98.4 F (36.9 C)   Wt 157 lb (71.2 kg)   LMP  (LMP Unknown)   SpO2 99%   BMI 27.99 kg/m    Subjective:    Patient ID: Mary Maynard, female    DOB: [birth year], 65 y.o.   MRN: [id number]  HPI: Mary Maynard is a 65 y.o. female  Chief Complaint  Patient presents with  . Follow-up    ED follow up for URI; Since Sunday morning. Still coughing. Nasal Congestion, runny nose. Throat irritated from coughing.  No known fever.   Patient presents for ER follow up for a viral URI Sunday. Was given tessalon and claritin. Having some relief. Still having mildly productive cough and generally not feeling well. Thought she may have had a fever prior to going to the ER that morning. None since. Denies CP, SOB, wheezing, nasal congestion.    Relevant past medical, surgical, family and social history reviewed and updated as indicated. Interim medical history since our last visit reviewed. Allergies and medications reviewed and updated.  Review of Systems  Constitutional: Negative.   HENT: Positive for sore throat.   Eyes: Negative.   Respiratory: Positive for cough.   Cardiovascular: Negative.   Gastrointestinal: Negative.   Genitourinary: Negative.   Musculoskeletal: Negative.   Skin: Negative.   Neurological: Negative.   Psychiatric/Behavioral: Negative.     Per HPI unless specifically indicated above     Objective:    BP 108/60   Pulse 70   Temp 98.4 F (36.9 C)   Wt 157 lb (71.2 kg)   LMP  (LMP Unknown)   SpO2 99%   BMI 27.99 kg/m   Wt Readings from Last 3 Encounters:  07/21/16 157 lb (71.2 kg)  07/18/16 152 lb (68.9 kg)  05/25/16 152 lb 12.8 oz (69.3 kg)    Physical Exam  Constitutional: She is oriented to person, place, and time. She appears well-developed and well-nourished. No distress.  HENT:  Head: Atraumatic.  Right Ear: External ear normal.  Left Ear: External ear normal.  Nose: Nose normal.  Mouth/Throat: Oropharynx is clear  and moist. No oropharyngeal exudate.  Eyes: Conjunctivae and EOM are normal. Pupils are equal, round, and reactive to light. No scleral icterus.  Neck: Normal range of motion. Neck supple.  Cardiovascular: Normal rate.   Pulmonary/Chest: Effort normal and breath sounds normal. No respiratory distress. She has no wheezes. She has no rales.  Musculoskeletal: Normal range of motion.  Lymphadenopathy:    She has no cervical adenopathy.  Neurological: She is alert and oriented to person, place, and time.  Skin: Skin is warm and dry.  Psychiatric: She has a normal mood and affect. Her behavior is normal.  Nursing note and vitals reviewed.     Assessment & Plan:   Problem List Items Addressed This Visit    None    Visit Diagnoses    Viral URI with cough    -  Primary   Continue tessalon perles and claritin, humidifier, rest. Follow up if no improvement.        Follow up plan: Return if symptoms worsen or fail to improve, for A1C as soon as possible.

## 2016-07-21 NOTE — Patient Instructions (Signed)
Follow up as needed

## 2016-08-02 ENCOUNTER — Encounter: Payer: Self-pay | Admitting: Unknown Physician Specialty

## 2016-08-02 ENCOUNTER — Ambulatory Visit
Admission: RE | Admit: 2016-08-02 | Discharge: 2016-08-02 | Disposition: A | Discharge status: Home / Self Care | Payer: Commercial Managed Care - HMO | Source: Ambulatory Visit | Attending: Unknown Physician Specialty | Admitting: Unknown Physician Specialty

## 2016-08-02 DIAGNOSIS — Z13820 Encounter for screening for osteoporosis: Secondary | ICD-10-CM | POA: Diagnosis not present

## 2016-08-02 DIAGNOSIS — E2839 Other primary ovarian failure: Secondary | ICD-10-CM | POA: Diagnosis not present

## 2016-08-02 DIAGNOSIS — Z1239 Encounter for other screening for malignant neoplasm of breast: Secondary | ICD-10-CM

## 2016-08-02 DIAGNOSIS — Z1231 Encounter for screening mammogram for malignant neoplasm of breast: Secondary | ICD-10-CM | POA: Diagnosis not present

## 2016-08-03 ENCOUNTER — Encounter: Payer: Self-pay | Admitting: Unknown Physician Specialty

## 2016-08-03 ENCOUNTER — Ambulatory Visit (INDEPENDENT_AMBULATORY_CARE_PROVIDER_SITE_OTHER): Payer: Commercial Managed Care - HMO | Admitting: Unknown Physician Specialty

## 2016-08-03 VITALS — BP 120/71 | HR 74 | Temp 97.5°F | Ht 62.7 in | Wt 157.8 lb

## 2016-08-03 DIAGNOSIS — R05 Cough: Secondary | ICD-10-CM

## 2016-08-03 DIAGNOSIS — R059 Cough, unspecified: Secondary | ICD-10-CM

## 2016-08-03 DIAGNOSIS — J00 Acute nasopharyngitis [common cold]: Secondary | ICD-10-CM

## 2016-08-03 NOTE — Patient Instructions (Signed)
Use OTC Delsum

## 2016-08-03 NOTE — Progress Notes (Signed)
BP 120/71 (BP Location: Left Arm, Patient Position: Sitting, Cuff Size: Large)   Pulse 74   Temp 97.5 F (36.4 C)   Ht 5' 2.7" (1.593 m)   Wt 157 lb 12.8 oz (71.6 kg)   LMP  (LMP Unknown)   SpO2 98%   BMI 28.22 kg/m    Subjective:    Patient ID: Mary Maynard, female    DOB: 1951-01-27, 65 y.o.   MRN: [id number]  HPI: Mary Maynard is a 65 y.o. female  Chief Complaint  Patient presents with  . Follow-up   URI Pt states she is here to f/u for her URI and went to the ER.  She was given Tessalon Perles and loratadine.  She is still coughing but she does feel better.  No fever.  ER note was reviewed.    Relevant past medical, surgical, family and social history reviewed and updated as indicated. Interim medical history since our last visit reviewed. Allergies and medications reviewed and updated.  Review of Systems  Per HPI unless specifically indicated above     Objective:    BP 120/71 (BP Location: Left Arm, Patient Position: Sitting, Cuff Size: Large)   Pulse 74   Temp 97.5 F (36.4 C)   Ht 5' 2.7" (1.593 m)   Wt 157 lb 12.8 oz (71.6 kg)   LMP  (LMP Unknown)   SpO2 98%   BMI 28.22 kg/m   Wt Readings from Last 3 Encounters:  08/03/16 157 lb 12.8 oz (71.6 kg)  07/21/16 157 lb (71.2 kg)  07/18/16 152 lb (68.9 kg)    Physical Exam  Constitutional: She is oriented to person, place, and time. She appears well-developed and well-nourished. No distress.  HENT:  Head: Normocephalic and atraumatic.  Right Ear: Tympanic membrane and ear canal normal.  Left Ear: Tympanic membrane and ear canal normal.  Nose: Rhinorrhea present. Right sinus exhibits no maxillary sinus tenderness and no frontal sinus tenderness. Left sinus exhibits no maxillary sinus tenderness and no frontal sinus tenderness.  Mouth/Throat: Mucous membranes are normal. Posterior oropharyngeal erythema present.  Eyes: Conjunctivae and lids are normal. Right eye exhibits no discharge. Left eye exhibits no  discharge. No scleral icterus.  Cardiovascular: Normal rate and regular rhythm.   Pulmonary/Chest: Effort normal and breath sounds normal. No respiratory distress.  Abdominal: Normal appearance. There is no splenomegaly or hepatomegaly.  Musculoskeletal: Normal range of motion.  Neurological: She is alert and oriented to person, place, and time.  Skin: Skin is intact. No rash noted. No pallor.  Psychiatric: She has a normal mood and affect. Her behavior is normal. Judgment and thought content normal.    Results for orders placed or performed in visit on 05/25/16  Comprehensive metabolic panel  Result Value Ref Range   Glucose 127 (H) 65 - 99 mg/dL   BUN 15 8 - 27 mg/dL   Creatinine, Ser 4.090.89 0.57 - 1.00 mg/dL   GFR calc non Af Amer 68 >59 mL/min/1.73   GFR calc Af Amer 79 >59 mL/min/1.73   BUN/Creatinine Ratio 17 12 - 28   Sodium 143 134 - 144 mmol/L   Potassium 4.3 3.5 - 5.2 mmol/L   Chloride 102 96 - 106 mmol/L   CO2 26 18 - 29 mmol/L   Calcium 9.7 8.7 - 10.3 mg/dL   Total Protein 6.9 6.0 - 8.5 g/dL   Albumin 4.4 3.6 - 4.8 g/dL   Globulin, Total 2.5 1.5 - 4.5 g/dL   Albumin/Globulin Ratio  1.8 1.2 - 2.2   Bilirubin Total 0.7 0.0 - 1.2 mg/dL   Alkaline Phosphatase 48 39 - 117 IU/L   AST 15 0 - 40 IU/L   ALT 12 0 - 32 IU/L  Bayer DCA Hb A1c Waived  Result Value Ref Range   Bayer DCA Hb A1c Waived 6.8 <7.0 %  Lipid Panel w/o Chol/HDL Ratio  Result Value Ref Range   Cholesterol, Total 144 100 - 199 mg/dL   Triglycerides 55 0 - 149 mg/dL   HDL 53 >62>39 mg/dL   VLDL Cholesterol Cal 11 5 - 40 mg/dL   LDL Calculated 80 0 - 99 mg/dL  Microalbumin, Urine Waived  Result Value Ref Range   Microalb, Ur Waived 30 (H) 0 - 19 mg/L   Creatinine, Urine Waived 100 10 - 300 mg/dL   Microalb/Creat Ratio <30 <30 mg/g      Assessment & Plan:   Problem List Items Addressed This Visit    None    Visit Diagnoses    Acute nasopharyngitis    -  Primary   Seems to be improving   Cough         Still a problem.  Does not want to take Tessalon.  Recommended OTC Delsum       Follow up plan: Return if symptoms worsen or fail to improve.

## 2016-08-18 ENCOUNTER — Telehealth: Payer: Self-pay | Admitting: Unknown Physician Specialty

## 2016-08-18 DIAGNOSIS — H25013 Cortical age-related cataract, bilateral: Secondary | ICD-10-CM | POA: Diagnosis not present

## 2016-08-18 DIAGNOSIS — E119 Type 2 diabetes mellitus without complications: Secondary | ICD-10-CM | POA: Diagnosis not present

## 2016-08-18 LAB — HM DIABETES EYE EXAM

## 2016-08-18 NOTE — Telephone Encounter (Signed)
[name] [facility] called to stating she needs a referral on this patient since the patient has human[employer]   Thank You  [name] [phone] [facility]

## 2016-08-19 NOTE — Telephone Encounter (Signed)
I believe this patient needs authorization and not a referral since it is [employer]. Is that correct?

## 2016-08-19 NOTE — Telephone Encounter (Signed)
Brit: I think this is your patient 

## 2016-08-19 NOTE — Telephone Encounter (Signed)
Yes.   Authorization entered and pending.

## 2016-08-20 NOTE — Telephone Encounter (Signed)
Authorization was approved.  #[id number]

## 2016-08-20 NOTE — Telephone Encounter (Signed)
Called and let [name] at [facility] office know that authorization was approved and gave her the authorization number.

## 2016-09-03 ENCOUNTER — Other Ambulatory Visit: Payer: Self-pay | Admitting: Family Medicine

## 2016-09-07 NOTE — Telephone Encounter (Signed)
Last OV: 08/03/16 Next OV: 11/24/16  BMP Latest Ref Rng & Units 05/25/2016 11/07/2015 04/07/2015  Glucose 65 - 99 mg/dL 161(W127(H) 960(A126(H) 540(J130(H)  BUN 8 - 27 mg/dL 15 14 18   Creatinine 0.57 - 1.00 mg/dL 8.110.89 9.140.87 7.820.99  BUN/Creat Ratio 12 - 28 17 16 18   Sodium 134 - 144 mmol/L 143 137 141  Potassium 3.5 - 5.2 mmol/L 4.3 4.7 4.4  Chloride 96 - 106 mmol/L 102 96 97  CO2 18 - 29 mmol/L 26 21 26   Calcium 8.7 - 10.3 mg/dL 9.7 9.8 10.0

## 2016-09-28 ENCOUNTER — Telehealth: Payer: Self-pay

## 2016-09-28 NOTE — Telephone Encounter (Signed)
Opened in error

## 2016-10-04 ENCOUNTER — Other Ambulatory Visit: Payer: Self-pay | Admitting: Family Medicine

## 2016-10-04 ENCOUNTER — Other Ambulatory Visit: Payer: Self-pay | Admitting: Unknown Physician Specialty

## 2016-11-02 ENCOUNTER — Encounter: Payer: Self-pay | Admitting: Family Medicine

## 2016-11-02 ENCOUNTER — Ambulatory Visit (INDEPENDENT_AMBULATORY_CARE_PROVIDER_SITE_OTHER): Payer: Medicare HMO | Admitting: Family Medicine

## 2016-11-02 VITALS — BP 122/79 | HR 70 | Temp 98.4°F | Wt 156.6 lb

## 2016-11-02 DIAGNOSIS — B9789 Other viral agents as the cause of diseases classified elsewhere: Secondary | ICD-10-CM | POA: Diagnosis not present

## 2016-11-02 DIAGNOSIS — J069 Acute upper respiratory infection, unspecified: Secondary | ICD-10-CM

## 2016-11-02 MED ORDER — BENZONATATE 100 MG PO CAPS: 200.0000 mg | ORAL_CAPSULE | Freq: Three times a day (TID) | ORAL | 0 refills | Status: DC | PRN

## 2016-11-02 NOTE — Progress Notes (Deleted)
   BP 122/79 (BP Location: Right Arm, Patient Position: Sitting, Cuff Size: Normal)   Pulse 70   Temp 98.4 F (36.9 C)   Wt 156 lb 9.6 oz (71 kg)   LMP  (LMP Unknown)   SpO2 100%   BMI 28.01 kg/m    Subjective:    Patient ID: Mary Maynard [place], female    DOB: 1950-12-03, 66 y.o.   MRN: [id number]  HPI: Mary Maynard [place]iven is a 66 y.o. female  Chief Complaint  Patient presents with  . Cough    x's 1 week.     Relevant past medical, surgical, family and social history reviewed and updated as indicated. Interim medical history since our last visit reviewed. Allergies and medications reviewed and updated.  Review of Systems  Per HPI unless specifically indicated above     Objective:    BP 122/79 (BP Location: Right Arm, Patient Position: Sitting, Cuff Size: Normal)   Pulse 70   Temp 98.4 F (36.9 C)   Wt 156 lb 9.6 oz (71 kg)   LMP  (LMP Unknown)   SpO2 100%   BMI 28.01 kg/m   Wt Readings from Last 3 Encounters:  11/02/16 156 lb 9.6 oz (71 kg)  08/03/16 157 lb 12.8 oz (71.6 kg)  07/21/16 157 lb (71.2 kg)    Physical Exam  Results for orders placed or performed in visit on 08/19/16  HM DIABETES EYE EXAM  Result Value Ref Range   HM Diabetic Eye Exam No Retinopathy No Retinopathy      Assessment & Plan:   Problem List Items Addressed This Visit    None       Follow up plan: No Follow-up on file.

## 2016-11-02 NOTE — Progress Notes (Signed)
BP 122/79 (BP Location: Right Arm, Patient Position: Sitting, Cuff Size: Normal)   Pulse 70   Temp 98.4 F (36.9 C)   Wt 156 lb 9.6 oz (71 kg)   LMP  (LMP Unknown)   SpO2 100%   BMI 28.01 kg/m    Subjective:    Patient ID: Mary Maynard, female    DOB: [birth year], 66 y.o.   MRN: [id number]  HPI: Mary Maynard is a 66 y.o. female  Chief Complaint  Patient presents with  . Cough    x's 1 week.   Cough Patient presents to the clinic with a chief complaint of nonproductive cough that keeps her up at night x 1 week. Associated symptoms include chest tightness from coughing, clear rhinorrhea. Denies ear pain, sore throat, SOB, fever/chills, N/V/D, abdominal pain, headache. She has tried cough drops and Claritin without relief. She received her flu shot this year and pneumonia vaccine last year. Denies sick contacts, tobacco use, smoke exposure.   Relevant past medical, surgical, family and social history reviewed and updated as indicated. Interim medical history since our last visit reviewed. Allergies and medications reviewed and updated.  Review of Systems  Constitutional: Negative for chills, fatigue and fever.  HENT: Positive for congestion and rhinorrhea. Negative for ear discharge, ear pain, sinus pain, sinus pressure, sore throat and trouble swallowing.   Eyes: Negative.   Respiratory: Positive for cough and chest tightness. Negative for shortness of breath, wheezing and stridor.   Cardiovascular: Negative for chest pain and palpitations.  Gastrointestinal: Negative for abdominal pain, constipation, diarrhea, nausea and vomiting.  Skin: Negative for rash.  Neurological: Negative for weakness and headaches.    Per HPI unless specifically indicated above     Objective:    BP 122/79 (BP Location: Right Arm, Patient Position: Sitting, Cuff Size: Normal)   Pulse 70   Temp 98.4 F (36.9 C)   Wt 156 lb 9.6 oz (71 kg)   LMP  (LMP Unknown)   SpO2 100%   BMI 28.01 kg/m     Wt Readings from Last 3 Encounters:  11/02/16 156 lb 9.6 oz (71 kg)  08/03/16 157 lb 12.8 oz (71.6 kg)  07/21/16 157 lb (71.2 kg)    Physical Exam  Constitutional: She is oriented to person, place, and time. She appears well-developed and well-nourished. No distress.  HENT:  Head: Normocephalic and atraumatic.  Nasal mucosa boggy and injected Oropharynx erythematous  Eyes: Conjunctivae are normal. Right eye exhibits no discharge. Left eye exhibits no discharge. No scleral icterus.  Neck: Normal range of motion. Neck supple. No JVD present.  Cardiovascular: Normal rate, regular rhythm, normal heart sounds and intact distal pulses.   Pulmonary/Chest: Effort normal and breath sounds normal. No respiratory distress. She has no wheezes. She has no rales. She exhibits tenderness.  Abdominal: Soft. Bowel sounds are normal.  Musculoskeletal: Normal range of motion.  Neurological: She is alert and oriented to person, place, and time.  Skin: Skin is warm and dry. No rash noted.  Psychiatric: She has a normal mood and affect. Her behavior is normal. Judgment and thought content normal.      Assessment & Plan:   Problem List Items Addressed This Visit    None    Visit Diagnoses    Viral URI with cough    -  Primary   Tessalon perles and OTC Nyquil. Advised to take a daily Claritin year round for allergic rhinitis.  Follow up plan: Return if symptoms worsen or fail to improve.

## 2016-11-03 NOTE — Patient Instructions (Signed)
Follow up as needed

## 2016-11-08 ENCOUNTER — Telehealth: Payer: Self-pay

## 2016-11-08 NOTE — Telephone Encounter (Signed)
Patient returned call. She stated that she has not heard of [facility] and that she did not request supplies from them Patient stated that she already had supplies. I wrote this information on form and faxed it back.

## 2016-11-08 NOTE — Telephone Encounter (Signed)
Received a fax from a [facility] requesting that we send in a prescription for diabetic testing supplies.This pharmacy is not listed in patient's chart so I wanted to know if she was really requesting this or not. Called and left patient a VM asking for her to please return my call.

## 2016-11-24 ENCOUNTER — Encounter: Payer: Self-pay | Admitting: Unknown Physician Specialty

## 2016-11-24 ENCOUNTER — Ambulatory Visit (INDEPENDENT_AMBULATORY_CARE_PROVIDER_SITE_OTHER): Payer: Medicare HMO | Admitting: Unknown Physician Specialty

## 2016-11-24 ENCOUNTER — Other Ambulatory Visit: Payer: Self-pay

## 2016-11-24 VITALS — BP 155/77 | HR 78 | Temp 98.4°F | Ht 62.6 in | Wt 157.3 lb

## 2016-11-24 DIAGNOSIS — R05 Cough: Secondary | ICD-10-CM | POA: Diagnosis not present

## 2016-11-24 DIAGNOSIS — J309 Allergic rhinitis, unspecified: Secondary | ICD-10-CM

## 2016-11-24 DIAGNOSIS — E782 Mixed hyperlipidemia: Secondary | ICD-10-CM

## 2016-11-24 DIAGNOSIS — Z23 Encounter for immunization: Secondary | ICD-10-CM

## 2016-11-24 DIAGNOSIS — Z0000 Encounter for general adult medical examination without abnormal findings: Secondary | ICD-10-CM | POA: Diagnosis not present

## 2016-11-24 DIAGNOSIS — E119 Type 2 diabetes mellitus without complications: Secondary | ICD-10-CM

## 2016-11-24 DIAGNOSIS — I10 Essential (primary) hypertension: Secondary | ICD-10-CM

## 2016-11-24 DIAGNOSIS — R059 Cough, unspecified: Secondary | ICD-10-CM

## 2016-11-24 LAB — BAYER DCA HB A1C WAIVED: HB A1C (BAYER DCA - WAIVED): 7.1 % — ABNORMAL HIGH (ref <7.0)

## 2016-11-24 MED ORDER — MONTELUKAST SODIUM 10 MG PO TABS: 10.0000 mg | ORAL_TABLET | Freq: Every day | ORAL | 3 refills | Status: AC

## 2016-11-24 MED ORDER — GUAIFENESIN-CODEINE 100-10 MG/5ML PO SOLN: 10.0000 mL | Freq: Three times a day (TID) | ORAL | 0 refills | Status: DC | PRN

## 2016-11-24 MED ORDER — FLUTICASONE PROPIONATE 50 MCG/ACT NA SUSP: 2.0000 | Freq: Every day | NASAL | 6 refills | Status: AC

## 2016-11-24 NOTE — Assessment & Plan Note (Signed)
She feels this is due to her cough

## 2016-11-24 NOTE — Assessment & Plan Note (Signed)
Hgb A1C is 7.1 today.  Feels she can do more with her diet and exercise.  She has been away from exercise due to cough

## 2016-11-24 NOTE — Patient Instructions (Signed)

## 2016-11-24 NOTE — Progress Notes (Signed)
BP (!) 155/77 (BP Location: Left Arm, Patient Position: Sitting, Cuff Size: Normal)   Pulse 78   Temp 98.4 F (36.9 C)   Ht 5' 2.6" (1.59 m)   Wt 157 lb 4.8 oz (71.4 kg)   LMP  (LMP Unknown)   SpO2 98%   BMI 28.22 kg/m    Subjective:    Patient ID: Mary Maynard, female    DOB: 22-Nov-1950, 66 y.o.   MRN: [id number]  HPI: Mary Maynard is a 66 y.o. female  Chief Complaint  Patient presents with  . Diabetes  . Hypertension  . Cough    pt states she is still coughing and states that tessalon did not help, wonders if it could be one of her medications    Diabetes: Using medications without difficulties No hypoglycemic episodes No hyperglycemic episodes Feet problems: Blood Sugars averaging: eye exam within last year Last Hgb A1C: 6.8  Hypertension  Using medications without difficulty Average home BPs   Using medication without problems or lightheadedness No chest pain with exertion or shortness of breath No Edema  Elevated Cholesterol Using medications without problems No Muscle aches  Diet: Exercise:  Eating well but doesn't feel like exercising due to cough  Cough Was seen 2/27 for a cough.  This is persistent since then and feels although it is better, she is still up all night with the cough.  Tessalon doesn't seem to work.  Still has nasal congestion.     Relevant past medical, surgical, family and social history reviewed and updated as indicated. Interim medical history since our last visit reviewed. Allergies and medications reviewed and updated.  Review of Systems  Per HPI unless specifically indicated above     Objective:    BP (!) 155/77 (BP Location: Left Arm, Patient Position: Sitting, Cuff Size: Normal)   Pulse 78   Temp 98.4 F (36.9 C)   Ht 5' 2.6" (1.59 m)   Wt 157 lb 4.8 oz (71.4 kg)   LMP  (LMP Unknown)   SpO2 98%   BMI 28.22 kg/m   Wt Readings from Last 3 Encounters:  11/24/16 157 lb 4.8 oz (71.4 kg)  11/02/16 156 lb 9.6 oz (71  kg)  08/03/16 157 lb 12.8 oz (71.6 kg)    Physical Exam  Constitutional: She is oriented to person, place, and time. She appears well-developed and well-nourished. No distress.  HENT:  Head: Normocephalic and atraumatic.  Right Ear: Tympanic membrane and ear canal normal.  Left Ear: Tympanic membrane and ear canal normal.  Nose: Rhinorrhea present. Right sinus exhibits no maxillary sinus tenderness and no frontal sinus tenderness. Left sinus exhibits no maxillary sinus tenderness and no frontal sinus tenderness.  Mouth/Throat: Mucous membranes are normal. Posterior oropharyngeal erythema present.  Eyes: Conjunctivae and lids are normal. Right eye exhibits no discharge. Left eye exhibits no discharge. No scleral icterus.  Cardiovascular: Normal rate and regular rhythm.   Pulmonary/Chest: Effort normal and breath sounds normal. No respiratory distress.  Abdominal: Normal appearance. There is no splenomegaly or hepatomegaly.  Musculoskeletal: Normal range of motion.  Neurological: She is alert and oriented to person, place, and time.  Skin: Skin is intact. No rash noted. No pallor.  Psychiatric: She has a normal mood and affect. Her behavior is normal. Judgment and thought content normal.    Results for orders placed or performed in visit on 08/19/16  HM DIABETES EYE EXAM  Result Value Ref Range   HM Diabetic Eye Exam No  Retinopathy No Retinopathy      Assessment & Plan:   Problem List Items Addressed This Visit      Unprioritized   Diabetes (HCC)    Hgb A1C is 7.1 today.  Feels she can do more with her diet and exercise.  She has been away from exercise due to cough      Relevant Orders   Comprehensive metabolic panel   Bayer DCA Hb W0JA1c Waived   Hyperlipidemia   Hypertension    She feels this is due to her cough      Relevant Orders   Comprehensive metabolic panel    Other Visit Diagnoses    Need for pneumococcal vaccination    -  Primary   Relevant Orders    Pneumococcal polysaccharide vaccine 23-valent greater than or equal to 2yo subcutaneous/IM (Completed)   Routine general medical examination at a health care facility       Relevant Orders   Hepatitis C antibody   Acute allergic rhinitis, unspecified seasonality, unspecified trigger       Rx for Flonase and Singulair for cough and allergies.     Cough       Probably related to allergies   Relevant Medications   guaiFENesin-codeine 100-10 MG/5ML syrup       Follow up plan: Return in about 3 months (around 02/24/2017).

## 2016-11-25 LAB — HEPATITIS C ANTIBODY

## 2016-11-25 LAB — COMPREHENSIVE METABOLIC PANEL
ALK PHOS: 49 IU/L (ref 39–117)
ALT: 11 IU/L (ref 0–32)
AST: 14 IU/L (ref 0–40)
Albumin/Globulin Ratio: 1.7 (ref 1.2–2.2)
Albumin: 4.4 g/dL (ref 3.6–4.8)
BILIRUBIN TOTAL: 0.5 mg/dL (ref 0.0–1.2)
BUN / CREAT RATIO: 17 (ref 12–28)
BUN: 14 mg/dL (ref 8–27)
CHLORIDE: 100 mmol/L (ref 96–106)
CO2: 21 mmol/L (ref 18–29)
Calcium: 9.5 mg/dL (ref 8.7–10.3)
Creatinine, Ser: 0.84 mg/dL (ref 0.57–1.00)
GFR calc Af Amer: 84 mL/min/{1.73_m2} (ref >59)
GFR calc non Af Amer: 73 mL/min/{1.73_m2} (ref >59)
GLOBULIN, TOTAL: 2.6 g/dL (ref 1.5–4.5)
GLUCOSE: 107 mg/dL — AB (ref 65–99)
POTASSIUM: 4.9 mmol/L (ref 3.5–5.2)
SODIUM: 139 mmol/L (ref 134–144)
Total Protein: 7 g/dL (ref 6.0–8.5)

## 2016-12-13 ENCOUNTER — Other Ambulatory Visit: Payer: Self-pay | Admitting: Family Medicine

## 2016-12-14 NOTE — Telephone Encounter (Signed)
Last OV: 11/24/16 Next OV:   Lab Results  Component Value Date   CHOL 144 05/25/2016   HDL 53 05/25/2016   LDLCALC 80 05/25/2016   TRIG 55 05/25/2016   Lab Results  Component Value Date   CREATININE 0.84 11/24/2016   BUN 14 11/24/2016   NA 139 11/24/2016   K 4.9 11/24/2016   CL 100 11/24/2016   CO2 21 11/24/2016    BMP Latest Ref Rng & Units 11/24/2016 05/25/2016 11/07/2015  Glucose 65 - 99 mg/dL 147(W) 295(A) 213(Y)  BUN 8 - 27 mg/dL Creatinine 0.57 - 1.00 mg/dL 8.65 7.84 6.96  BUN/Creat Ratio 12 - Sodium 134 - 144 mmol/L 139 143 137  Potassium 3.5 - 5.2 mmol/L 4.9 4.3 4.7  Chloride 96 - 106 mmol/L 100 102 96  CO2 18 - 29 mmol/L Calcium 8.7 - 10.3 mg/dL 9.5 9.7 9.8

## 2017-02-10 DIAGNOSIS — E7800 Pure hypercholesterolemia, unspecified: Secondary | ICD-10-CM | POA: Diagnosis not present

## 2017-02-10 DIAGNOSIS — E119 Type 2 diabetes mellitus without complications: Secondary | ICD-10-CM | POA: Diagnosis not present

## 2017-02-10 DIAGNOSIS — M5416 Radiculopathy, lumbar region: Secondary | ICD-10-CM | POA: Diagnosis not present

## 2017-02-10 DIAGNOSIS — I10 Essential (primary) hypertension: Secondary | ICD-10-CM | POA: Diagnosis not present

## 2017-02-15 ENCOUNTER — Other Ambulatory Visit: Payer: Self-pay | Admitting: Unknown Physician Specialty

## 2017-03-10 DIAGNOSIS — L209 Atopic dermatitis, unspecified: Secondary | ICD-10-CM | POA: Diagnosis not present

## 2017-05-12 DIAGNOSIS — E119 Type 2 diabetes mellitus without complications: Secondary | ICD-10-CM | POA: Diagnosis not present

## 2017-05-12 DIAGNOSIS — I10 Essential (primary) hypertension: Secondary | ICD-10-CM | POA: Diagnosis not present

## 2017-05-12 DIAGNOSIS — E7800 Pure hypercholesterolemia, unspecified: Secondary | ICD-10-CM | POA: Diagnosis not present

## 2017-06-21 DIAGNOSIS — Z0000 Encounter for general adult medical examination without abnormal findings: Secondary | ICD-10-CM | POA: Diagnosis not present

## 2017-06-21 DIAGNOSIS — E119 Type 2 diabetes mellitus without complications: Secondary | ICD-10-CM | POA: Diagnosis not present

## 2017-06-23 ENCOUNTER — Other Ambulatory Visit: Payer: Self-pay | Admitting: Internal Medicine

## 2017-06-23 DIAGNOSIS — Z1231 Encounter for screening mammogram for malignant neoplasm of breast: Secondary | ICD-10-CM

## 2017-08-03 ENCOUNTER — Ambulatory Visit
Admission: RE | Admit: 2017-08-03 | Discharge: 2017-08-03 | Disposition: A | Discharge status: Home / Self Care | Payer: Commercial Managed Care - HMO | Source: Ambulatory Visit | Attending: Internal Medicine | Admitting: Internal Medicine

## 2017-08-03 DIAGNOSIS — Z1231 Encounter for screening mammogram for malignant neoplasm of breast: Secondary | ICD-10-CM | POA: Insufficient documentation

## 2017-08-06 DIAGNOSIS — R300 Dysuria: Secondary | ICD-10-CM | POA: Diagnosis not present

## 2017-08-06 DIAGNOSIS — N390 Urinary tract infection, site not specified: Secondary | ICD-10-CM | POA: Diagnosis not present

## 2017-08-17 DIAGNOSIS — H25013 Cortical age-related cataract, bilateral: Secondary | ICD-10-CM | POA: Diagnosis not present

## 2017-08-17 DIAGNOSIS — E119 Type 2 diabetes mellitus without complications: Secondary | ICD-10-CM | POA: Diagnosis not present

## 2017-08-23 DIAGNOSIS — I10 Essential (primary) hypertension: Secondary | ICD-10-CM | POA: Diagnosis not present

## 2017-08-23 DIAGNOSIS — E119 Type 2 diabetes mellitus without complications: Secondary | ICD-10-CM | POA: Diagnosis not present

## 2017-08-23 DIAGNOSIS — N898 Other specified noninflammatory disorders of vagina: Secondary | ICD-10-CM | POA: Diagnosis not present

## 2017-08-25 DIAGNOSIS — I10 Essential (primary) hypertension: Secondary | ICD-10-CM | POA: Diagnosis not present

## 2017-08-25 DIAGNOSIS — E7800 Pure hypercholesterolemia, unspecified: Secondary | ICD-10-CM | POA: Diagnosis not present

## 2017-08-25 DIAGNOSIS — F411 Generalized anxiety disorder: Secondary | ICD-10-CM | POA: Diagnosis not present

## 2017-08-25 DIAGNOSIS — E119 Type 2 diabetes mellitus without complications: Secondary | ICD-10-CM | POA: Diagnosis not present

## 2017-09-08 DIAGNOSIS — I2510 Atherosclerotic heart disease of native coronary artery without angina pectoris: Secondary | ICD-10-CM | POA: Diagnosis not present

## 2017-09-08 DIAGNOSIS — E119 Type 2 diabetes mellitus without complications: Secondary | ICD-10-CM | POA: Diagnosis not present

## 2017-09-08 DIAGNOSIS — Z1211 Encounter for screening for malignant neoplasm of colon: Secondary | ICD-10-CM | POA: Diagnosis not present

## 2017-09-13 DIAGNOSIS — N760 Acute vaginitis: Secondary | ICD-10-CM | POA: Diagnosis not present

## 2017-09-13 DIAGNOSIS — B9689 Other specified bacterial agents as the cause of diseases classified elsewhere: Secondary | ICD-10-CM | POA: Diagnosis not present

## 2017-09-13 DIAGNOSIS — R300 Dysuria: Secondary | ICD-10-CM | POA: Diagnosis not present

## 2017-09-19 ENCOUNTER — Other Ambulatory Visit: Payer: Self-pay | Admitting: Unknown Physician Specialty

## 2017-09-19 DIAGNOSIS — K648 Other hemorrhoids: Secondary | ICD-10-CM | POA: Diagnosis not present

## 2017-09-19 DIAGNOSIS — Z1211 Encounter for screening for malignant neoplasm of colon: Secondary | ICD-10-CM | POA: Diagnosis not present

## 2017-09-19 DIAGNOSIS — K640 First degree hemorrhoids: Secondary | ICD-10-CM | POA: Diagnosis not present

## 2017-11-03 ENCOUNTER — Telehealth: Payer: Self-pay

## 2017-11-03 NOTE — Telephone Encounter (Signed)
Copied from CRM [phone]. Topic: Medicare AWV >> Nov 03, 2017  1:56 PM [place], [place], LPN wrote: Reason for CRM: Called to schedule medicare annual wellness visit with [facility] Tiffany Hill,LP[facility]. Please schedule anytime.  Any questions please contact Bedelia PersonKathyrn Brown on skype or by phone- [phone]

## 2017-11-21 ENCOUNTER — Other Ambulatory Visit: Payer: Self-pay | Admitting: Unknown Physician Specialty

## 2017-12-13 DIAGNOSIS — E119 Type 2 diabetes mellitus without complications: Secondary | ICD-10-CM | POA: Diagnosis not present

## 2017-12-20 DIAGNOSIS — I10 Essential (primary) hypertension: Secondary | ICD-10-CM | POA: Diagnosis not present

## 2017-12-20 DIAGNOSIS — F411 Generalized anxiety disorder: Secondary | ICD-10-CM | POA: Diagnosis not present

## 2017-12-20 DIAGNOSIS — E119 Type 2 diabetes mellitus without complications: Secondary | ICD-10-CM | POA: Diagnosis not present

## 2017-12-20 DIAGNOSIS — E7800 Pure hypercholesterolemia, unspecified: Secondary | ICD-10-CM | POA: Diagnosis not present

## 2018-01-23 ENCOUNTER — Other Ambulatory Visit: Payer: Self-pay | Admitting: Unknown Physician Specialty

## 2018-02-15 ENCOUNTER — Other Ambulatory Visit: Payer: Self-pay | Admitting: Unknown Physician Specialty

## 2018-02-26 DIAGNOSIS — R21 Rash and other nonspecific skin eruption: Secondary | ICD-10-CM | POA: Diagnosis not present

## 2018-03-07 DIAGNOSIS — E119 Type 2 diabetes mellitus without complications: Secondary | ICD-10-CM | POA: Diagnosis not present

## 2018-03-15 DIAGNOSIS — E119 Type 2 diabetes mellitus without complications: Secondary | ICD-10-CM | POA: Diagnosis not present

## 2018-03-15 DIAGNOSIS — F411 Generalized anxiety disorder: Secondary | ICD-10-CM | POA: Diagnosis not present

## 2018-05-31 DIAGNOSIS — M150 Primary generalized (osteo)arthritis: Secondary | ICD-10-CM | POA: Diagnosis not present

## 2018-05-31 DIAGNOSIS — M25521 Pain in right elbow: Secondary | ICD-10-CM | POA: Diagnosis not present

## 2018-06-21 DIAGNOSIS — F411 Generalized anxiety disorder: Secondary | ICD-10-CM | POA: Diagnosis not present

## 2018-06-21 DIAGNOSIS — E119 Type 2 diabetes mellitus without complications: Secondary | ICD-10-CM | POA: Diagnosis not present

## 2018-06-23 ENCOUNTER — Other Ambulatory Visit: Payer: Self-pay | Admitting: Internal Medicine

## 2018-06-23 DIAGNOSIS — Z1231 Encounter for screening mammogram for malignant neoplasm of breast: Secondary | ICD-10-CM

## 2018-07-05 DIAGNOSIS — Z0000 Encounter for general adult medical examination without abnormal findings: Secondary | ICD-10-CM | POA: Diagnosis not present

## 2018-07-05 DIAGNOSIS — E119 Type 2 diabetes mellitus without complications: Secondary | ICD-10-CM | POA: Diagnosis not present

## 2018-07-05 DIAGNOSIS — Z23 Encounter for immunization: Secondary | ICD-10-CM | POA: Diagnosis not present

## 2018-08-07 ENCOUNTER — Ambulatory Visit
Admission: RE | Admit: 2018-08-07 | Discharge: 2018-08-07 | Disposition: A | Discharge status: Home / Self Care | Payer: Commercial Managed Care - HMO | Source: Ambulatory Visit | Attending: Internal Medicine | Admitting: Internal Medicine

## 2018-08-07 DIAGNOSIS — Z1231 Encounter for screening mammogram for malignant neoplasm of breast: Secondary | ICD-10-CM | POA: Diagnosis not present

## 2018-08-16 DIAGNOSIS — H25013 Cortical age-related cataract, bilateral: Secondary | ICD-10-CM | POA: Diagnosis not present

## 2018-08-16 DIAGNOSIS — E119 Type 2 diabetes mellitus without complications: Secondary | ICD-10-CM | POA: Diagnosis not present

## 2018-09-01 IMAGING — MG 2D DIGITAL SCREENING BILATERAL MAMMOGRAM WITH CAD AND ADJUNCT TO
8 of 12 series · 8 of 28 positions shown · non-contrast
Comparison: Previous exam(s).

CLINICAL DATA: Screening.

EXAM:
2D DIGITAL SCREENING BILATERAL MAMMOGRAM WITH CAD AND ADJUNCT TOMO

[L CC]
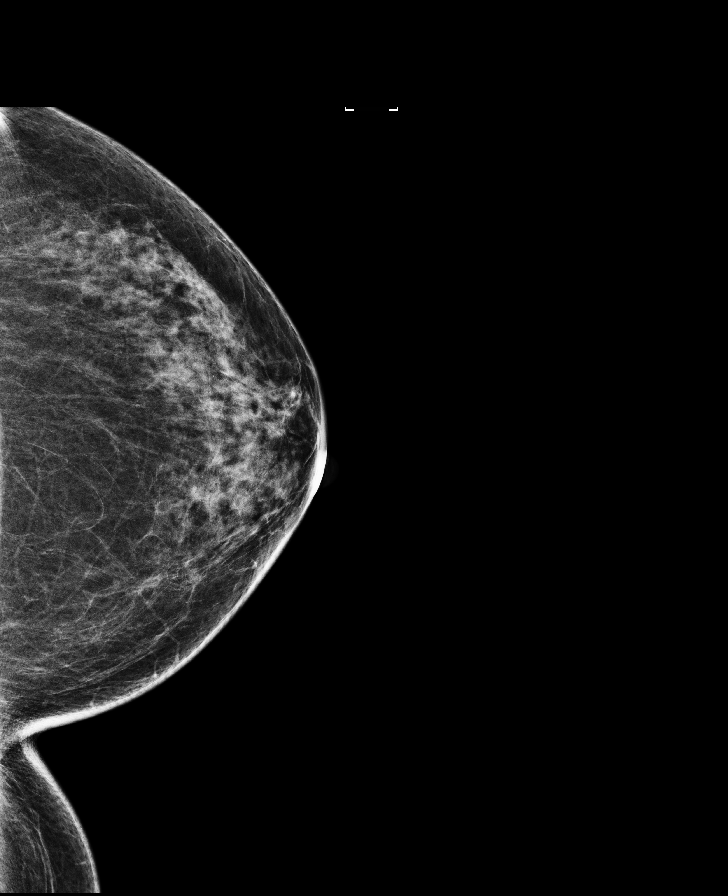

[R CC]
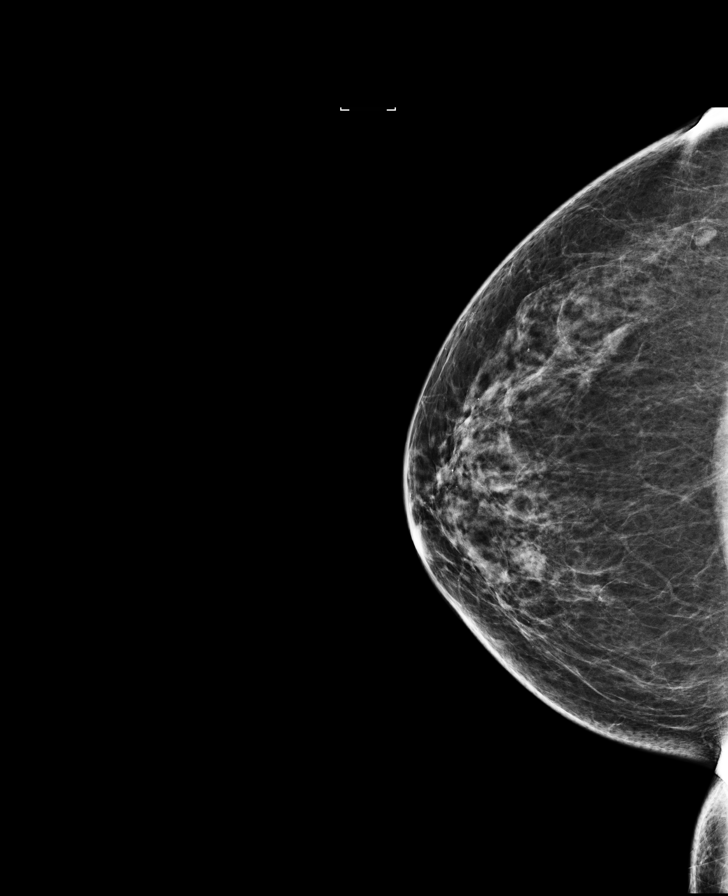

[R MLO synth-2D]
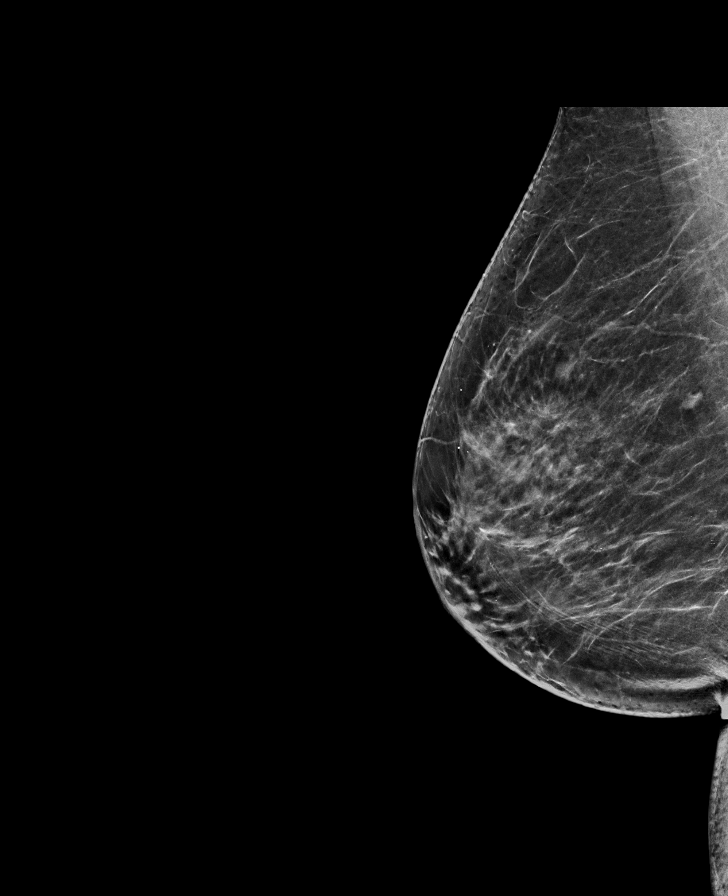

[R MLO]
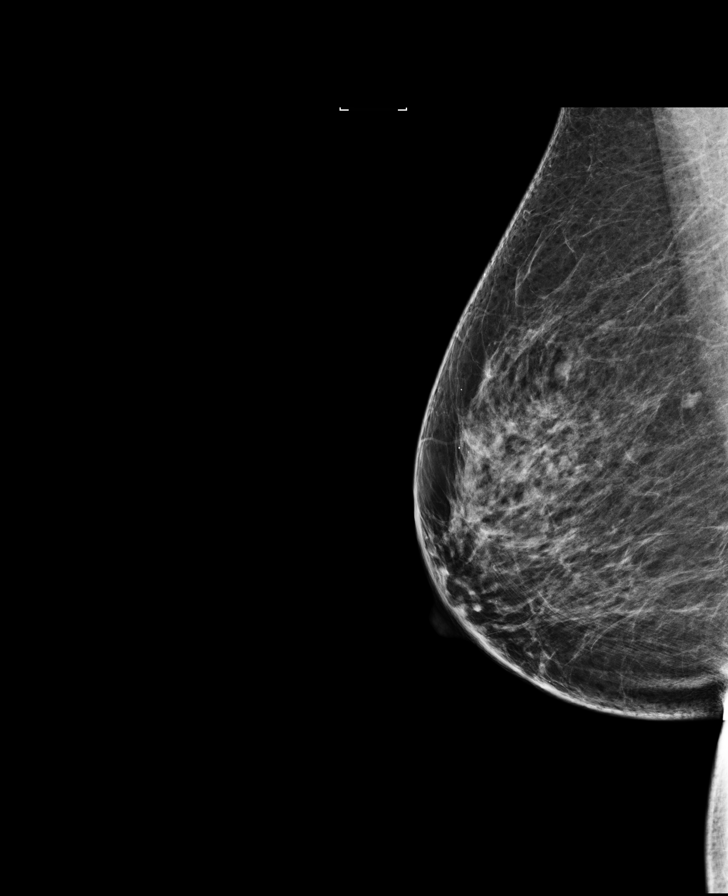

[L MLO]
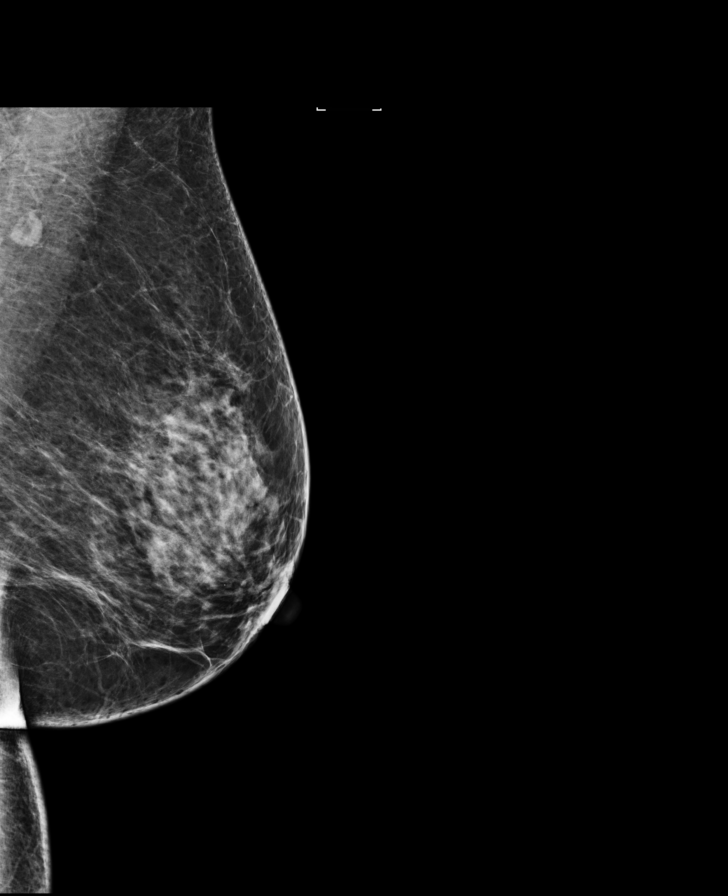

[L CC synth-2D]
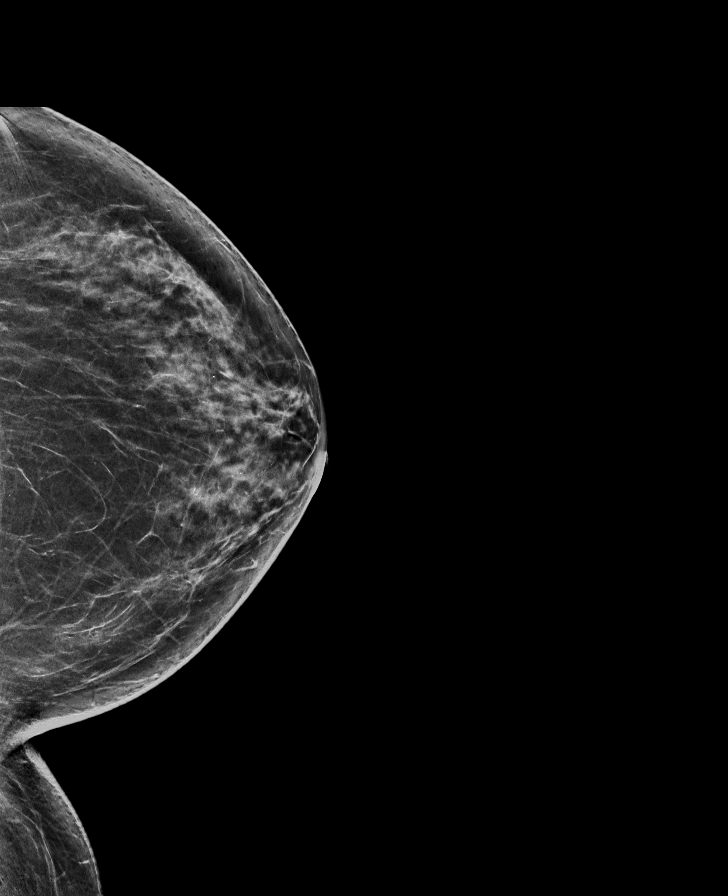

[L MLO synth-2D]
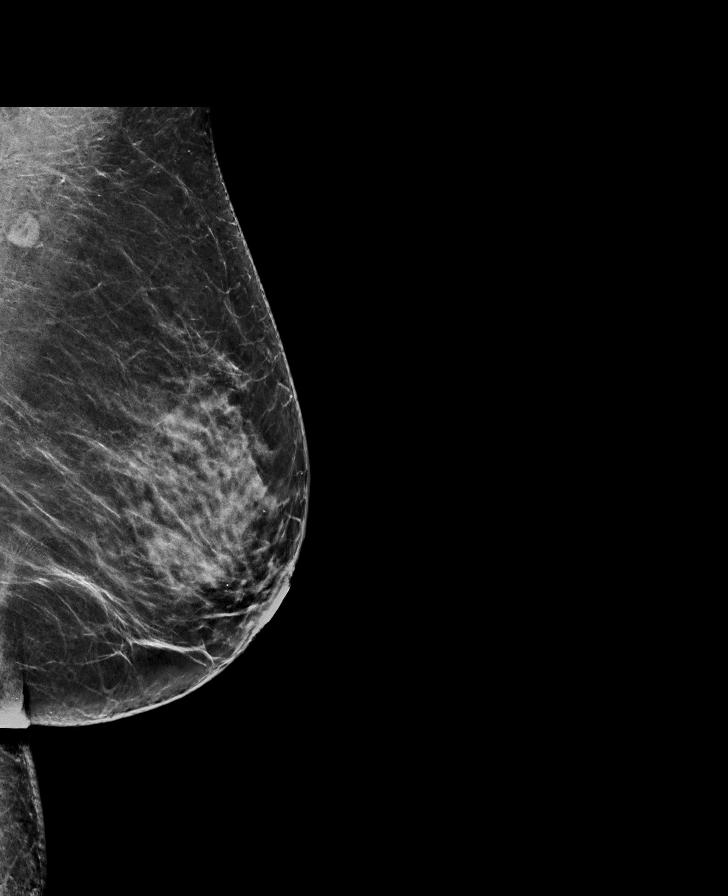

[R CC synth-2D]
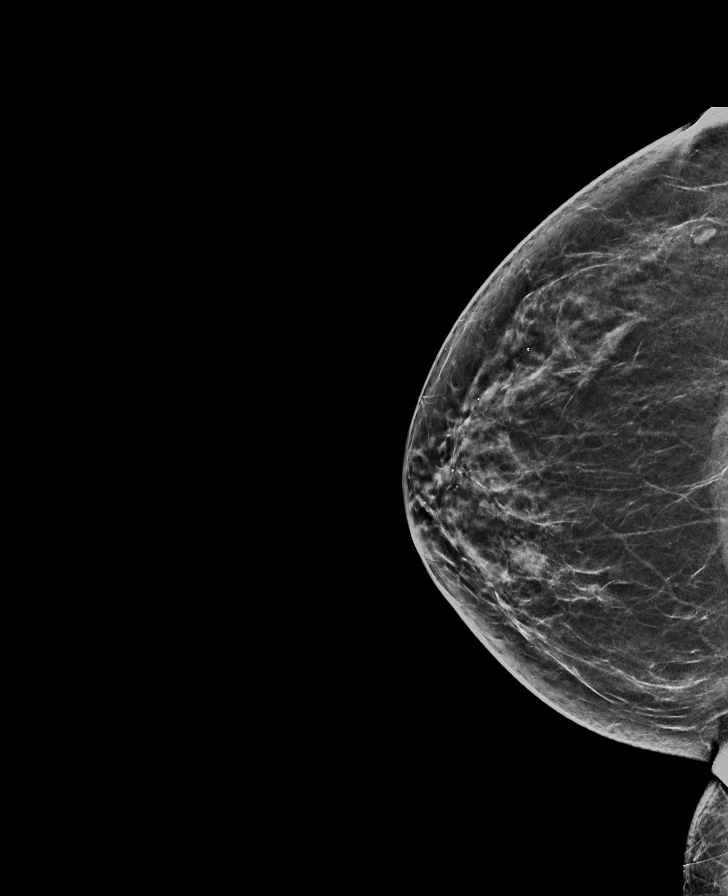

[8 of 28 positions shown; findings below may reference images not displayed]

ACR Breast Density Category c: The breast tissue is heterogeneously
dense, which may obscure small masses.
FINDINGS: There are no findings suspicious for malignancy. Images were
processed with CAD.
IMPRESSION: No mammographic evidence of malignancy. A result letter of this
screening mammogram will be mailed directly to the patient.

RECOMMENDATION:
Screening mammogram in one year. (Code:TN-[id number])

BI-RADS CATEGORY  1: Negative.

## 2018-12-22 DIAGNOSIS — I10 Essential (primary) hypertension: Secondary | ICD-10-CM | POA: Diagnosis not present

## 2018-12-22 DIAGNOSIS — M25562 Pain in left knee: Secondary | ICD-10-CM | POA: Diagnosis not present

## 2018-12-22 DIAGNOSIS — J029 Acute pharyngitis, unspecified: Secondary | ICD-10-CM | POA: Diagnosis not present

## 2018-12-22 DIAGNOSIS — E119 Type 2 diabetes mellitus without complications: Secondary | ICD-10-CM | POA: Diagnosis not present

## 2018-12-22 DIAGNOSIS — R05 Cough: Secondary | ICD-10-CM | POA: Diagnosis not present

## 2018-12-28 DIAGNOSIS — E119 Type 2 diabetes mellitus without complications: Secondary | ICD-10-CM | POA: Diagnosis not present

## 2018-12-28 DIAGNOSIS — J029 Acute pharyngitis, unspecified: Secondary | ICD-10-CM | POA: Diagnosis not present

## 2019-01-04 DIAGNOSIS — I10 Essential (primary) hypertension: Secondary | ICD-10-CM | POA: Diagnosis not present

## 2019-01-04 DIAGNOSIS — E119 Type 2 diabetes mellitus without complications: Secondary | ICD-10-CM | POA: Diagnosis not present

## 2019-04-02 DIAGNOSIS — R69 Illness, unspecified: Secondary | ICD-10-CM | POA: Diagnosis not present

## 2019-04-05 DIAGNOSIS — Z0000 Encounter for general adult medical examination without abnormal findings: Secondary | ICD-10-CM | POA: Diagnosis not present

## 2019-04-05 DIAGNOSIS — I2510 Atherosclerotic heart disease of native coronary artery without angina pectoris: Secondary | ICD-10-CM | POA: Diagnosis not present

## 2019-04-05 DIAGNOSIS — I10 Essential (primary) hypertension: Secondary | ICD-10-CM | POA: Diagnosis not present

## 2019-04-05 DIAGNOSIS — E119 Type 2 diabetes mellitus without complications: Secondary | ICD-10-CM | POA: Diagnosis not present

## 2019-04-05 DIAGNOSIS — E7800 Pure hypercholesterolemia, unspecified: Secondary | ICD-10-CM | POA: Diagnosis not present

## 2019-04-05 DIAGNOSIS — F411 Generalized anxiety disorder: Secondary | ICD-10-CM | POA: Diagnosis not present

## 2019-07-04 ENCOUNTER — Other Ambulatory Visit: Payer: Self-pay | Admitting: Internal Medicine

## 2019-07-04 DIAGNOSIS — Z1231 Encounter for screening mammogram for malignant neoplasm of breast: Secondary | ICD-10-CM

## 2019-07-25 DIAGNOSIS — E119 Type 2 diabetes mellitus without complications: Secondary | ICD-10-CM | POA: Diagnosis not present

## 2019-07-25 DIAGNOSIS — H25013 Cortical age-related cataract, bilateral: Secondary | ICD-10-CM | POA: Diagnosis not present

## 2019-08-09 ENCOUNTER — Ambulatory Visit
Admission: RE | Admit: 2019-08-09 | Discharge: 2019-08-09 | Disposition: A | Discharge status: Home / Self Care | Payer: Medicare HMO | Source: Ambulatory Visit | Attending: Internal Medicine | Admitting: Internal Medicine

## 2019-08-09 DIAGNOSIS — Z1231 Encounter for screening mammogram for malignant neoplasm of breast: Secondary | ICD-10-CM | POA: Diagnosis not present

## 2019-08-10 DIAGNOSIS — Z79899 Other long term (current) drug therapy: Secondary | ICD-10-CM | POA: Diagnosis not present

## 2019-08-10 DIAGNOSIS — E7800 Pure hypercholesterolemia, unspecified: Secondary | ICD-10-CM | POA: Diagnosis not present

## 2019-08-10 DIAGNOSIS — F411 Generalized anxiety disorder: Secondary | ICD-10-CM | POA: Diagnosis not present

## 2019-08-10 DIAGNOSIS — E119 Type 2 diabetes mellitus without complications: Secondary | ICD-10-CM | POA: Diagnosis not present

## 2019-08-10 DIAGNOSIS — I10 Essential (primary) hypertension: Secondary | ICD-10-CM | POA: Diagnosis not present

## 2019-08-19 ENCOUNTER — Emergency Department: Payer: Medicare HMO

## 2019-08-19 ENCOUNTER — Emergency Department
Admission: EM | Admit: 2019-08-19 | Discharge: 2019-08-19 | Disposition: A | Discharge status: Home / Self Care | Payer: Medicare HMO | Attending: Emergency Medicine | Admitting: Emergency Medicine

## 2019-08-19 ENCOUNTER — Other Ambulatory Visit: Payer: Self-pay

## 2019-08-19 DIAGNOSIS — Z7982 Long term (current) use of aspirin: Secondary | ICD-10-CM | POA: Insufficient documentation

## 2019-08-19 DIAGNOSIS — I259 Chronic ischemic heart disease, unspecified: Secondary | ICD-10-CM | POA: Diagnosis not present

## 2019-08-19 DIAGNOSIS — S199XXA Unspecified injury of neck, initial encounter: Secondary | ICD-10-CM | POA: Diagnosis not present

## 2019-08-19 DIAGNOSIS — Z7984 Long term (current) use of oral hypoglycemic drugs: Secondary | ICD-10-CM | POA: Diagnosis not present

## 2019-08-19 DIAGNOSIS — S161XXA Strain of muscle, fascia and tendon at neck level, initial encounter: Secondary | ICD-10-CM | POA: Insufficient documentation

## 2019-08-19 DIAGNOSIS — S0990XA Unspecified injury of head, initial encounter: Secondary | ICD-10-CM | POA: Diagnosis not present

## 2019-08-19 DIAGNOSIS — S6991XA Unspecified injury of right wrist, hand and finger(s), initial encounter: Secondary | ICD-10-CM | POA: Diagnosis not present

## 2019-08-19 DIAGNOSIS — I10 Essential (primary) hypertension: Secondary | ICD-10-CM | POA: Insufficient documentation

## 2019-08-19 DIAGNOSIS — Z79899 Other long term (current) drug therapy: Secondary | ICD-10-CM | POA: Insufficient documentation

## 2019-08-19 DIAGNOSIS — Y999 Unspecified external cause status: Secondary | ICD-10-CM | POA: Diagnosis not present

## 2019-08-19 DIAGNOSIS — E119 Type 2 diabetes mellitus without complications: Secondary | ICD-10-CM | POA: Insufficient documentation

## 2019-08-19 DIAGNOSIS — S60011A Contusion of right thumb without damage to nail, initial encounter: Secondary | ICD-10-CM | POA: Diagnosis not present

## 2019-08-19 DIAGNOSIS — Y92414 Local residential or business street as the place of occurrence of the external cause: Secondary | ICD-10-CM | POA: Diagnosis not present

## 2019-08-19 DIAGNOSIS — M542 Cervicalgia: Secondary | ICD-10-CM | POA: Diagnosis not present

## 2019-08-19 DIAGNOSIS — Y93I9 Activity, other involving external motion: Secondary | ICD-10-CM | POA: Insufficient documentation

## 2019-08-19 DIAGNOSIS — M79641 Pain in right hand: Secondary | ICD-10-CM | POA: Diagnosis not present

## 2019-08-19 DIAGNOSIS — R52 Pain, unspecified: Secondary | ICD-10-CM | POA: Diagnosis not present

## 2019-08-19 MED ORDER — ACETAMINOPHEN 500 MG PO TABS
1000.0000 mg | ORAL_TABLET | Freq: Once | ORAL | Status: AC
  Administered 2019-08-19: 1000 mg via ORAL
  Filled 2019-08-19: qty 2

## 2019-08-19 NOTE — ED Notes (Signed)
Pt states she was able to reach her son for a ride
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
## 2019-08-19 NOTE — Discharge Instructions (Signed)
Follow-up with your primary care provider if any continued problems.  You will be more sore tomorrow than you are currently.  Continue taking Tylenol as needed for pain.  You may also use ice to your muscles as needed for discomfort.  Ice and elevate your right hand to reduce swelling and help with pain.  The thumb spica splint was placed on your hand to support and protect your right thumb.
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
## 2019-09-05 DIAGNOSIS — F411 Generalized anxiety disorder: Secondary | ICD-10-CM | POA: Diagnosis not present

## 2019-09-05 DIAGNOSIS — E119 Type 2 diabetes mellitus without complications: Secondary | ICD-10-CM | POA: Diagnosis not present

## 2019-09-05 DIAGNOSIS — E538 Deficiency of other specified B group vitamins: Secondary | ICD-10-CM | POA: Diagnosis not present

## 2019-09-19 DIAGNOSIS — F419 Anxiety disorder, unspecified: Secondary | ICD-10-CM | POA: Diagnosis not present

## 2019-09-19 DIAGNOSIS — Z0000 Encounter for general adult medical examination without abnormal findings: Secondary | ICD-10-CM | POA: Diagnosis not present

## 2019-09-24 DIAGNOSIS — R519 Headache, unspecified: Secondary | ICD-10-CM | POA: Diagnosis not present

## 2019-09-24 DIAGNOSIS — Z79899 Other long term (current) drug therapy: Secondary | ICD-10-CM | POA: Diagnosis not present

## 2019-09-24 DIAGNOSIS — I10 Essential (primary) hypertension: Secondary | ICD-10-CM | POA: Diagnosis not present

## 2019-09-24 DIAGNOSIS — F419 Anxiety disorder, unspecified: Secondary | ICD-10-CM | POA: Diagnosis not present

## 2019-10-02 DIAGNOSIS — E119 Type 2 diabetes mellitus without complications: Secondary | ICD-10-CM | POA: Diagnosis not present

## 2019-10-09 DIAGNOSIS — Z0000 Encounter for general adult medical examination without abnormal findings: Secondary | ICD-10-CM | POA: Diagnosis not present

## 2019-10-09 DIAGNOSIS — I2510 Atherosclerotic heart disease of native coronary artery without angina pectoris: Secondary | ICD-10-CM | POA: Diagnosis not present

## 2019-10-09 DIAGNOSIS — E119 Type 2 diabetes mellitus without complications: Secondary | ICD-10-CM | POA: Diagnosis not present

## 2019-10-09 DIAGNOSIS — Z79899 Other long term (current) drug therapy: Secondary | ICD-10-CM | POA: Diagnosis not present

## 2019-10-09 DIAGNOSIS — I10 Essential (primary) hypertension: Secondary | ICD-10-CM | POA: Diagnosis not present

## 2019-10-09 DIAGNOSIS — F411 Generalized anxiety disorder: Secondary | ICD-10-CM | POA: Diagnosis not present

## 2020-02-06 ENCOUNTER — Other Ambulatory Visit: Payer: Self-pay | Admitting: Acute Care

## 2020-02-06 DIAGNOSIS — R413 Other amnesia: Secondary | ICD-10-CM

## 2020-02-17 ENCOUNTER — Other Ambulatory Visit: Payer: Self-pay

## 2020-02-17 ENCOUNTER — Ambulatory Visit
Admission: RE | Admit: 2020-02-17 | Discharge: 2020-02-17 | Disposition: A | Discharge status: Home / Self Care | Payer: Medicare Other | Source: Ambulatory Visit | Attending: Acute Care | Admitting: Acute Care

## 2020-02-17 DIAGNOSIS — R413 Other amnesia: Secondary | ICD-10-CM | POA: Diagnosis not present

## 2020-07-04 ENCOUNTER — Other Ambulatory Visit: Payer: Self-pay | Admitting: Internal Medicine

## 2020-07-04 DIAGNOSIS — Z1231 Encounter for screening mammogram for malignant neoplasm of breast: Secondary | ICD-10-CM

## 2020-08-12 ENCOUNTER — Other Ambulatory Visit: Payer: Self-pay

## 2020-08-12 ENCOUNTER — Ambulatory Visit
Admission: RE | Admit: 2020-08-12 | Discharge: 2020-08-12 | Disposition: A | Discharge status: Home / Self Care | Payer: Medicare Other | Source: Ambulatory Visit | Attending: Internal Medicine | Admitting: Internal Medicine

## 2020-08-12 DIAGNOSIS — Z1231 Encounter for screening mammogram for malignant neoplasm of breast: Secondary | ICD-10-CM | POA: Insufficient documentation

## 2021-09-18 ENCOUNTER — Other Ambulatory Visit: Payer: Self-pay | Admitting: Internal Medicine

## 2021-09-18 DIAGNOSIS — Z1231 Encounter for screening mammogram for malignant neoplasm of breast: Secondary | ICD-10-CM

## 2022-01-11 ENCOUNTER — Ambulatory Visit
Admission: RE | Admit: 2022-01-11 | Discharge: 2022-01-11 | Disposition: A | Discharge status: Home / Self Care | Payer: Medicare Other | Source: Ambulatory Visit | Attending: Internal Medicine | Admitting: Internal Medicine

## 2022-01-11 DIAGNOSIS — Z1231 Encounter for screening mammogram for malignant neoplasm of breast: Secondary | ICD-10-CM | POA: Insufficient documentation

## 2022-12-03 ENCOUNTER — Other Ambulatory Visit: Payer: Self-pay | Admitting: Internal Medicine

## 2022-12-03 DIAGNOSIS — Z1231 Encounter for screening mammogram for malignant neoplasm of breast: Secondary | ICD-10-CM
